# Patient Record
Sex: Male | Born: 1965 | Race: White | Hispanic: No | Marital: Single | Smoking: Current every day smoker
Health system: Southern US, Community
[De-identification: ages and names within clinical notes are randomized; demographics above are authoritative.]

## PROBLEM LIST (undated history)

## (undated) DIAGNOSIS — F419 Anxiety disorder, unspecified: Secondary | ICD-10-CM

## (undated) DIAGNOSIS — M549 Dorsalgia, unspecified: Secondary | ICD-10-CM

## (undated) DIAGNOSIS — I480 Paroxysmal atrial fibrillation: Secondary | ICD-10-CM

---

## 2010-10-30 ENCOUNTER — Emergency Department (HOSPITAL_BASED_OUTPATIENT_CLINIC_OR_DEPARTMENT_OTHER)
Admission: EM | Admit: 2010-10-30 | Discharge: 2010-10-30 | Disposition: A | Discharge status: Home / Self Care | Payer: Self-pay | Attending: Emergency Medicine | Admitting: Emergency Medicine

## 2010-10-30 ENCOUNTER — Emergency Department (INDEPENDENT_AMBULATORY_CARE_PROVIDER_SITE_OTHER): Payer: Self-pay

## 2010-10-30 DIAGNOSIS — F17200 Nicotine dependence, unspecified, uncomplicated: Secondary | ICD-10-CM | POA: Insufficient documentation

## 2010-10-30 DIAGNOSIS — W269XXA Contact with unspecified sharp object(s), initial encounter: Secondary | ICD-10-CM

## 2010-10-30 DIAGNOSIS — W268XXA Contact with other sharp object(s), not elsewhere classified, initial encounter: Secondary | ICD-10-CM | POA: Insufficient documentation

## 2010-10-30 DIAGNOSIS — M19049 Primary osteoarthritis, unspecified hand: Secondary | ICD-10-CM

## 2010-10-30 DIAGNOSIS — S61411A Laceration without foreign body of right hand, initial encounter: Secondary | ICD-10-CM

## 2010-10-30 DIAGNOSIS — Y92009 Unspecified place in unspecified non-institutional (private) residence as the place of occurrence of the external cause: Secondary | ICD-10-CM | POA: Insufficient documentation

## 2010-10-30 DIAGNOSIS — S61409A Unspecified open wound of unspecified hand, initial encounter: Secondary | ICD-10-CM | POA: Insufficient documentation

## 2010-10-30 MED ORDER — TETANUS-DIPHTH-ACELL PERTUSSIS 5-2.5-18.5 LF-MCG/0.5 IM SUSP
INTRAMUSCULAR | Status: AC
  Filled 2010-10-30: qty 0.5

## 2010-10-30 MED ORDER — HYDROCODONE-ACETAMINOPHEN 5-325 MG PO TABS: 2.0000 | ORAL_TABLET | ORAL | Status: AC | PRN

## 2010-10-30 MED ORDER — TETANUS-DIPHTH-ACELL PERTUSSIS 5-2.5-18.5 LF-MCG/0.5 IM SUSP
0.5000 mL | Freq: Once | INTRAMUSCULAR | Status: AC
  Administered 2010-10-30: 0.5 mL via INTRAMUSCULAR

## 2010-10-30 NOTE — ED Notes (Signed)
Laceration to right hand while jacking up a car trailer

## 2010-10-30 NOTE — ED Notes (Signed)
Pt stated he was checking on his wife in the waiting room

## 2010-10-30 NOTE — ED Provider Notes (Signed)
History     CSN: [id number] Arrival date & time: 10/30/2010  1:56 PM  Chief Complaint  Patient presents with  . Hand Injury   Patient is a 45 y.o. male presenting with hand injury. The history is provided by the patient.  Hand Injury  The incident occurred less than 1 hour ago. The incident occurred at home. The injury mechanism was an incision. The pain is present in the right hand. The quality of the pain is described as sharp and burning. The pain is at a severity of 3/10. The pain is moderate. The pain has been constant since the incident. Pertinent negatives include no fever. He reports no foreign bodies present. The symptoms are aggravated by movement. He has tried nothing for the symptoms. The treatment provided no relief.  Pt cut finger over a broken piece of metal.  History reviewed. No pertinent past medical history.  History reviewed. No pertinent past surgical history.  History reviewed. No pertinent family history.  History  Substance Use Topics  . Smoking status: Current Everyday Smoker -- 1.0 packs/day  . Smokeless tobacco: Not on file  . Alcohol Use: Yes     1/2 pint/ day      Review of Systems  Constitutional: Negative for fever.  Skin: Positive for wound.  All other systems reviewed and are negative.    Physical Exam  BP 140/85  Pulse 80  Temp(Src) 98.1 F (36.7 C) (Oral)  Resp 20  SpO2 96%  Physical Exam  Nursing note and vitals reviewed. Constitutional: He is oriented to person, place, and time. He appears well-developed.  HENT:  Head: Normocephalic.  Neck: Normal range of motion.  Cardiovascular: Normal rate.   Pulmonary/Chest: Effort normal.  Musculoskeletal: He exhibits tenderness.  Neurological: He is alert and oriented to person, place, and time. He has normal reflexes.  Skin: There is erythema.       1 cm laceration between thumb and 2nd finger    ED Course  LACERATION REPAIR Date/Time: 10/30/2010 3:44 PM Performed by: Langston Masker Authorized by: Valma Cava A. Consent: Verbal consent obtained. Consent given by: patient Laceration length: 1 cm Foreign bodies: no foreign bodies Tendon involvement: none Nerve involvement: none Vascular damage: no Anesthesia: local infiltration Local anesthetic: lidocaine 2% without epinephrine Patient sedated: no Preparation: Patient was prepped and draped in the usual sterile fashion. Irrigation solution: saline Irrigation method: syringe Amount of cleaning: standard Debridement: none Degree of undermining: none Skin closure: 4-0 Prolene Number of sutures: 2 Technique: simple Approximation: loose Approximation difficulty: simple Patient tolerance: Patient tolerated the procedure well with no immediate complications.    MDM I discussed need for follow up and suture removal in 8 days   Medical screening examination/treatment/procedure(s) were performed by non-physician practitioner and as supervising physician I was immediately available for consultation/collaboration. Osvaldo Human, M.D.   Ali Molina, [place] 10/30/10 1545  Carleene Cooper III, MD 10/31/10 [phone]

## 2013-04-27 ENCOUNTER — Ambulatory Visit: Payer: Self-pay

## 2014-03-10 ENCOUNTER — Emergency Department (HOSPITAL_BASED_OUTPATIENT_CLINIC_OR_DEPARTMENT_OTHER)
Admission: EM | Admit: 2014-03-10 | Discharge: 2014-03-10 | Disposition: A | Discharge status: Home / Self Care | Payer: Self-pay | Attending: Emergency Medicine | Admitting: Emergency Medicine

## 2014-03-10 ENCOUNTER — Encounter (HOSPITAL_BASED_OUTPATIENT_CLINIC_OR_DEPARTMENT_OTHER): Payer: Self-pay | Admitting: *Deleted

## 2014-03-10 DIAGNOSIS — Z720 Tobacco use: Secondary | ICD-10-CM | POA: Insufficient documentation

## 2014-03-10 DIAGNOSIS — R1084 Generalized abdominal pain: Secondary | ICD-10-CM | POA: Insufficient documentation

## 2014-03-10 DIAGNOSIS — R112 Nausea with vomiting, unspecified: Secondary | ICD-10-CM | POA: Insufficient documentation

## 2014-03-10 DIAGNOSIS — R634 Abnormal weight loss: Secondary | ICD-10-CM | POA: Insufficient documentation

## 2014-03-10 LAB — CBC WITH DIFFERENTIAL/PLATELET
Basophils Absolute: 0 10*3/uL (ref 0.0–0.1)
Basophils Relative: 0 % (ref 0–1)
EOS ABS: 0.1 10*3/uL (ref 0.0–0.7)
EOS PCT: 2 % (ref 0–5)
HEMATOCRIT: 42.1 % (ref 39.0–52.0)
Hemoglobin: 14.2 g/dL (ref 13.0–17.0)
LYMPHS ABS: 2.3 10*3/uL (ref 0.7–4.0)
LYMPHS PCT: 33 % (ref 12–46)
MCH: 32.6 pg (ref 26.0–34.0)
MCHC: 33.7 g/dL (ref 30.0–36.0)
MCV: 96.8 fL (ref 78.0–100.0)
MONO ABS: 0.8 10*3/uL (ref 0.1–1.0)
MONOS PCT: 12 % (ref 3–12)
Neutro Abs: 3.7 10*3/uL (ref 1.7–7.7)
Neutrophils Relative %: 53 % (ref 43–77)
PLATELETS: 266 10*3/uL (ref 150–400)
RBC: 4.35 MIL/uL (ref 4.22–5.81)
RDW: 13 % (ref 11.5–15.5)
WBC: 7 10*3/uL (ref 4.0–10.5)

## 2014-03-10 LAB — URINALYSIS, ROUTINE W REFLEX MICROSCOPIC
BILIRUBIN URINE: NEGATIVE
Glucose, UA: NEGATIVE mg/dL
Hgb urine dipstick: NEGATIVE
Ketones, ur: NEGATIVE mg/dL
Leukocytes, UA: NEGATIVE
NITRITE: NEGATIVE
Protein, ur: NEGATIVE mg/dL
SPECIFIC GRAVITY, URINE: 1.026 (ref 1.005–1.030)
UROBILINOGEN UA: 1 mg/dL (ref 0.0–1.0)
pH: 7.5 (ref 5.0–8.0)

## 2014-03-10 LAB — COMPREHENSIVE METABOLIC PANEL
ALBUMIN: 3.9 g/dL (ref 3.5–5.2)
ALT: 44 U/L (ref 0–53)
AST: 33 U/L (ref 0–37)
Alkaline Phosphatase: 86 U/L (ref 39–117)
Anion gap: 6 (ref 5–15)
BUN: 16 mg/dL (ref 6–23)
CALCIUM: 8.8 mg/dL (ref 8.4–10.5)
CHLORIDE: 109 meq/L (ref 96–112)
CO2: 27 mmol/L (ref 19–32)
CREATININE: 0.74 mg/dL (ref 0.50–1.35)
GFR calc Af Amer: >90 mL/min (ref >90)
Glucose, Bld: 98 mg/dL (ref 70–99)
Potassium: 4.1 mmol/L (ref 3.5–5.1)
SODIUM: 142 mmol/L (ref 135–145)
Total Bilirubin: 0.2 mg/dL — ABNORMAL LOW (ref 0.3–1.2)
Total Protein: 6.5 g/dL (ref 6.0–8.3)

## 2014-03-10 LAB — LIPASE, BLOOD: Lipase: 22 U/L (ref 11–59)

## 2014-03-10 MED ORDER — OMEPRAZOLE 20 MG PO CPDR: 20.0000 mg | DELAYED_RELEASE_CAPSULE | Freq: Every day | ORAL | Status: DC

## 2014-03-10 NOTE — ED Provider Notes (Signed)
CSN: [id number]     Arrival date & time 03/10/14  1522 History   First MD Initiated Contact with Patient 03/10/14 1718     Chief Complaint  Patient presents with  . Abdominal Pain     (Consider location/radiation/quality/duration/timing/severity/associated sxs/prior Treatment) HPI Comments: Patient presents with intermittent abdominal pain and weight loss. He states over last 3 months he's had some intermittent pain across his abdomen. The pain is usually in his lower abdomen but currently denies any pain. He has some intermittent vomiting. He states normally as he is eating he occasionally vomits up food. He feels like it's due to acid reflux. He's had about a 20-25 pound weight loss over the last 2-3 months. He denies any blood in his emesis. He denies any blood in the stool. He denies any melena. He's having normal bowel movements. He drinks alcohol frequently about a pint 5 days a week. He also drinks a lot of caffeine. Today he had one episode of vomiting and saw a small worm in his emesis. He has not seen any worms in his stool.  Patient is a 48 y.o. male presenting with abdominal pain.  Abdominal Pain Associated symptoms: nausea and vomiting   Associated symptoms: no chest pain, no chills, no cough, no diarrhea, no fatigue, no fever, no hematuria and no shortness of breath     History reviewed. No pertinent past medical history. History reviewed. No pertinent past surgical history. No family history on file. History  Substance Use Topics  . Smoking status: Current Every Day Smoker -- 1.00 packs/day  . Smokeless tobacco: Not on file  . Alcohol Use: Yes     Comment: 1/2 pint/ day    Review of Systems  Constitutional: Positive for unexpected weight change. Negative for fever, chills, diaphoresis and fatigue.  HENT: Negative for congestion, rhinorrhea and sneezing.   Eyes: Negative.   Respiratory: Negative for cough, chest tightness and shortness of breath.   Cardiovascular:  Negative for chest pain and leg swelling.  Gastrointestinal: Positive for nausea, vomiting and abdominal pain. Negative for diarrhea and blood in stool.  Genitourinary: Negative for frequency, hematuria, flank pain and difficulty urinating.  Musculoskeletal: Negative for back pain and arthralgias.  Skin: Negative for rash.  Neurological: Negative for dizziness, speech difficulty, weakness, numbness and headaches.      Allergies  Review of patient's allergies indicates no known allergies.  Home Medications   Prior to Admission medications   Medication Sig Start Date End Date Taking? Authorizing Provider  omeprazole (PRILOSEC) 20 MG capsule Take 1 capsule (20 mg total) by mouth daily. 03/10/14   Rolan BuccoMelanie Daray Polgar, MD   BP 129/81 mmHg  Pulse 77  Temp(Src) 98.4 F (36.9 C) (Oral)  Resp 18  Ht 5\' 11"  (1.803 m)  Wt 155 lb (70.308 kg)  BMI 21.63 kg/m2  SpO2 99% Physical Exam  Constitutional: He is oriented to person, place, and time. He appears well-developed and well-nourished.  HENT:  Head: Normocephalic and atraumatic.  Eyes: Pupils are equal, round, and reactive to light.  Neck: Normal range of motion. Neck supple.  Cardiovascular: Normal rate, regular rhythm and normal heart sounds.   Pulmonary/Chest: Effort normal and breath sounds normal. No respiratory distress. He has no wheezes. He has no rales. He exhibits no tenderness.  Abdominal: Soft. Bowel sounds are normal. There is no tenderness. There is no rebound and no guarding.  Musculoskeletal: Normal range of motion. He exhibits no edema.  Lymphadenopathy:    He has no  cervical adenopathy.  Neurological: He is alert and oriented to person, place, and time.  Skin: Skin is warm and dry. No rash noted.  Psychiatric: He has a normal mood and affect.    ED Course  Procedures (including critical care time) Labs Review Results for orders placed or performed during the hospital encounter of 03/10/14  Urinalysis, Routine w reflex  microscopic  Result Value Ref Range   Color, Urine YELLOW YELLOW   APPearance CLEAR CLEAR   Specific Gravity, Urine 1.026 1.005 - 1.030   pH 7.5 5.0 - 8.0   Glucose, UA NEGATIVE NEGATIVE mg/dL   Hgb urine dipstick NEGATIVE NEGATIVE   Bilirubin Urine NEGATIVE NEGATIVE   Ketones, ur NEGATIVE NEGATIVE mg/dL   Protein, ur NEGATIVE NEGATIVE mg/dL   Urobilinogen, UA 1.0 0.0 - 1.0 mg/dL   Nitrite NEGATIVE NEGATIVE   Leukocytes, UA NEGATIVE NEGATIVE  Comprehensive metabolic panel  Result Value Ref Range   Sodium 142 135 - 145 mmol/L   Potassium 4.1 3.5 - 5.1 mmol/L   Chloride 109 96 - 112 mEq/L   CO2 27 19 - 32 mmol/L   Glucose, Bld 98 70 - 99 mg/dL   BUN 16 6 - 23 mg/dL   Creatinine, Ser 1.610.74 0.50 - 1.35 mg/dL   Calcium 8.8 8.4 - 09.610.5 mg/dL   Total Protein 6.5 6.0 - 8.3 g/dL   Albumin 3.9 3.5 - 5.2 g/dL   AST 33 0 - 37 U/L   ALT 44 0 - 53 U/L   Alkaline Phosphatase 86 39 - 117 U/L   Total Bilirubin 0.2 (L) 0.3 - 1.2 mg/dL   GFR calc non Af Amer >90 >90 mL/min   GFR calc Af Amer >90 >90 mL/min   Anion gap 6 5 - 15  CBC with Differential  Result Value Ref Range   WBC 7.0 4.0 - 10.5 K/uL   RBC 4.35 4.22 - 5.81 MIL/uL   Hemoglobin 14.2 13.0 - 17.0 g/dL   HCT 04.542.1 40.939.0 - 81.152.0 %   MCV 96.8 78.0 - 100.0 fL   MCH 32.6 26.0 - 34.0 pg   MCHC 33.7 30.0 - 36.0 g/dL   RDW 91.413.0 78.211.5 - 95.615.5 %   Platelets 266 150 - 400 K/uL   Neutrophils Relative % 53 43 - 77 %   Neutro Abs 3.7 1.7 - 7.7 K/uL   Lymphocytes Relative 33 12 - 46 %   Lymphs Abs 2.3 0.7 - 4.0 K/uL   Monocytes Relative 12 3 - 12 %   Monocytes Absolute 0.8 0.1 - 1.0 K/uL   Eosinophils Relative 2 0 - 5 %   Eosinophils Absolute 0.1 0.0 - 0.7 K/uL   Basophils Relative 0 0 - 1 %   Basophils Absolute 0.0 0.0 - 0.1 K/uL  Lipase, blood  Result Value Ref Range   Lipase 22 11 - 59 U/L   No results found.    Imaging Review No results found.   EKG Interpretation None      MDM   Final diagnoses:  Generalized abdominal  pain    Patient has no abdominal pain currently. His labs are unremarkable. He has some symptoms that are consistent with GERD. I will go ahead and start him on a PPI. He brings me in a small worm that he says he vomited up. I explained to him that this could've been in something he ate. I did encourage him to follow-up with a gastroenterologist and that if he continues to have these  symptoms that he likely will need a stool sample to assess for parasites in his stool. I gave him a referral to follow-up with gastroenterology.    Rolan Bucco, MD 03/10/14 [phone]

## 2014-03-10 NOTE — ED Notes (Addendum)
Abdominal pain x 3 months. Vomited yesterday with a worm in the vomitus. Weight loss for the past months. No energy.

## 2014-03-10 NOTE — Discharge Instructions (Signed)

## 2019-08-27 ENCOUNTER — Other Ambulatory Visit: Payer: Self-pay

## 2019-08-27 ENCOUNTER — Encounter (HOSPITAL_BASED_OUTPATIENT_CLINIC_OR_DEPARTMENT_OTHER): Payer: Self-pay | Admitting: *Deleted

## 2019-08-27 ENCOUNTER — Emergency Department (HOSPITAL_BASED_OUTPATIENT_CLINIC_OR_DEPARTMENT_OTHER)
Admission: EM | Admit: 2019-08-27 | Discharge: 2019-08-28 | Disposition: A | Discharge status: Home / Self Care | Payer: Self-pay | Attending: Emergency Medicine | Admitting: Emergency Medicine

## 2019-08-27 ENCOUNTER — Emergency Department (HOSPITAL_BASED_OUTPATIENT_CLINIC_OR_DEPARTMENT_OTHER): Payer: Self-pay

## 2019-08-27 DIAGNOSIS — F10129 Alcohol abuse with intoxication, unspecified: Secondary | ICD-10-CM | POA: Insufficient documentation

## 2019-08-27 DIAGNOSIS — Y9289 Other specified places as the place of occurrence of the external cause: Secondary | ICD-10-CM | POA: Insufficient documentation

## 2019-08-27 DIAGNOSIS — Y9352 Activity, horseback riding: Secondary | ICD-10-CM | POA: Insufficient documentation

## 2019-08-27 DIAGNOSIS — F10920 Alcohol use, unspecified with intoxication, uncomplicated: Secondary | ICD-10-CM

## 2019-08-27 DIAGNOSIS — S8264XA Nondisplaced fracture of lateral malleolus of right fibula, initial encounter for closed fracture: Secondary | ICD-10-CM | POA: Insufficient documentation

## 2019-08-27 DIAGNOSIS — Y998 Other external cause status: Secondary | ICD-10-CM | POA: Insufficient documentation

## 2019-08-27 DIAGNOSIS — F17210 Nicotine dependence, cigarettes, uncomplicated: Secondary | ICD-10-CM | POA: Insufficient documentation

## 2019-08-27 HISTORY — DX: Dorsalgia, unspecified: M54.9

## 2019-08-27 HISTORY — DX: Anxiety disorder, unspecified: F41.9

## 2019-08-27 MED ORDER — ACETAMINOPHEN 325 MG PO TABS
650.0000 mg | ORAL_TABLET | Freq: Once | ORAL | Status: AC
  Administered 2019-08-28: 650 mg via ORAL
  Filled 2019-08-27: qty 2

## 2019-08-27 NOTE — ED Triage Notes (Signed)
Brought by EMS from home , pt c/o fall from horse x 2 hrs ago , ETOH

## 2019-08-28 NOTE — ED Notes (Signed)
Pt. Stated he has crutches at home, right size from previous surgery.

## 2019-08-28 NOTE — ED Notes (Signed)
Patient transported to X-ray 

## 2019-08-28 NOTE — ED Provider Notes (Signed)
[facility] EMERGENCY DEPARTMENT Provider Note   CSN: [id number] Arrival date & time: 08/27/19  2314     History Chief Complaint  Patient presents with  . Ankle Injury    Matthew Shepard is a 54 y.o. male.  HPI     This is a 54 year old male with history of alcohol abuse who presents with right ankle pain.  Patient reports that he was riding his horse when he fell off landing on his right ankle.  Denies hitting his head or loss of consciousness.  Denies any other injury or pain.  He states that he has not been able to ambulate and rates his pain at 10 out of 10.  He has not taken anything for the pain.  He does report significant alcohol use today.  He drinks daily.  Denies any numbness or tingling of the foot.  Reports that he had to crawl back to his house.  Past Medical History:  Diagnosis Date  . Anxiety   . Back pain     There are no problems to display for this patient.   History reviewed. No pertinent surgical history.     History reviewed. No pertinent family history.  Social History   Tobacco Use  . Smoking status: Current Every Day Smoker    Packs/day: 1.00  . Smokeless tobacco: Never Used  Substance Use Topics  . Alcohol use: Yes    Comment: 1/2 pint/ day  . Drug use: No    Home Medications Prior to Admission medications   Medication Sig Start Date End Date Taking? Authorizing Provider  sertraline (ZOLOFT) 50 MG tablet Take by mouth. 04/17/19  Yes [provider]  gabapentin (NEURONTIN) 300 MG capsule Take 300 mg by mouth 3 (three) times daily. 04/17/19   [provider]  omeprazole (PRILOSEC) 20 MG capsule Take 1 capsule (20 mg total) by mouth daily. 03/10/14   Malvin Johns, MD    Allergies    Patient has no known allergies.  Review of Systems   Review of Systems  Respiratory: Negative for shortness of breath.   Cardiovascular: Negative for chest pain.  Musculoskeletal:       Right ankle pain  Neurological:  Negative for weakness, numbness and headaches.  All other systems reviewed and are negative.   Physical Exam Updated Vital Signs BP 117/82   Pulse 87   Temp 98.3 F (36.8 C) (Oral)   Resp 18   SpO2 94%   Physical Exam Vitals and nursing note reviewed.  Constitutional:      Appearance: He is well-developed.     Comments: Disheveled appearing but nontoxic, ABCs intact  HENT:     Head: Normocephalic and atraumatic.     Nose: Nose normal.     Mouth/Throat:     Mouth: Mucous membranes are dry.  Eyes:     Pupils: Pupils are equal, round, and reactive to light.  Cardiovascular:     Rate and Rhythm: Normal rate and regular rhythm.     Heart sounds: Normal heart sounds. No murmur heard.   Pulmonary:     Effort: Pulmonary effort is normal. No respiratory distress.     Breath sounds: Normal breath sounds. No wheezing.  Chest:     Chest wall: No tenderness.  Abdominal:     General: Bowel sounds are normal.     Palpations: Abdomen is soft.     Tenderness: There is no abdominal tenderness. There is no rebound.  Musculoskeletal:  Cervical back: Neck supple.     Comments: Patient with swelling about the medial and lateral aspects of the right ankle, no obvious deformity, 2+ DP pulse and neurovascular intact distally, tenderness to palpation over the lateral malleolus, no proximal fibular tenderness  Lymphadenopathy:     Cervical: No cervical adenopathy.  Skin:    General: Skin is warm and dry.     Comments: Multiple old excoriations and abrasions  Neurological:     Mental Status: He is alert and oriented to person, place, and time.  Psychiatric:     Comments: Appears intoxicated     ED Results / Procedures / Treatments   Labs (all labs ordered are listed, but only abnormal results are displayed) Labs Reviewed - No data to display  EKG None  Radiology DG Ankle Complete Right  Result Date: 08/28/2019 CLINICAL DATA:  Fall, deformity EXAM: RIGHT ANKLE - COMPLETE 3+ VIEW  COMPARISON:  None. FINDINGS: There is a nondisplaced obliquely oriented fracture of the distal fibula. The ankle mortise appears to be congruent. Diffuse soft tissue swelling seen over the fibula. IMPRESSION: Nondisplaced distal fibular fracture with overlying soft tissue swelling. Electronically Signed   By: Jonna Clark M.D.   On: 08/28/2019 00:25    Procedures Procedures (including critical care time)  Medications Ordered in ED Medications  acetaminophen (TYLENOL) tablet 650 mg (650 mg Oral Given 08/28/19 0033)    ED Course  I have reviewed the triage vital signs and the nursing notes.  Pertinent labs & imaging results that were available during my care of the patient were reviewed by me and considered in my medical decision making (see chart for details).    MDM Rules/Calculators/A&P                           Patient presents with an injury to the right ankle.  He is clearly intoxicated but there is no obvious other injury.  Reports that he did not hit his head or lose consciousness.  He is otherwise awake, alert, oriented.  He has soft tissue swelling to the right ankle with lateral malleoli or tenderness.  No proximal fibular tenderness.  Considerations include fracture versus sprain.  X-rays obtained.  X-rays reviewed by myself and show a nondisplaced distal fibular fracture.  Patient was immobilized and given crutches.  I discussed with him that he needs to watch his alcohol use as this will increase his likelihood of falls.  I recommended ice and elevation.  He was not provided with any narcotic pain medication as I feel this could make him altered.  Recommend ibuprofen or Tylenol.  Patient was referred to orthopedics.  In basket message sent to Dr. Susa Simmonds.  After history, exam, and medical workup I feel the patient has been appropriately medically screened and is safe for discharge home. Pertinent diagnoses were discussed with the patient. Patient was given return  precautions.   Final Clinical Impression(s) / ED Diagnoses Final diagnoses:  Closed nondisplaced fracture of lateral malleolus of right fibula, initial encounter  Alcoholic intoxication without complication [facility])    Rx / DC Orders ED Discharge Orders    None       Shon Baton, MD 08/28/19 0126

## 2019-08-28 NOTE — Discharge Instructions (Addendum)
You were seen today for an injury to the right ankle.  You have a fracture of your lateral malleolus.  Keep iced and elevated.  Follow-up with orthopedics.  Do not bear weight.  You should also decrease alcohol consumption as this will increase your risk for falls.

## 2019-08-28 NOTE — ED Notes (Signed)
Notified wife for pick in 30 mins

## 2020-04-10 ENCOUNTER — Encounter (HOSPITAL_BASED_OUTPATIENT_CLINIC_OR_DEPARTMENT_OTHER): Payer: Self-pay | Admitting: Emergency Medicine

## 2020-04-10 ENCOUNTER — Emergency Department (HOSPITAL_BASED_OUTPATIENT_CLINIC_OR_DEPARTMENT_OTHER)
Admission: EM | Admit: 2020-04-10 | Discharge: 2020-04-10 | Disposition: A | Discharge status: Home / Self Care | Payer: Self-pay | Attending: Emergency Medicine | Admitting: Emergency Medicine

## 2020-04-10 ENCOUNTER — Emergency Department (HOSPITAL_BASED_OUTPATIENT_CLINIC_OR_DEPARTMENT_OTHER): Payer: Self-pay

## 2020-04-10 ENCOUNTER — Other Ambulatory Visit: Payer: Self-pay

## 2020-04-10 DIAGNOSIS — R1013 Epigastric pain: Secondary | ICD-10-CM | POA: Insufficient documentation

## 2020-04-10 DIAGNOSIS — F10230 Alcohol dependence with withdrawal, uncomplicated: Secondary | ICD-10-CM

## 2020-04-10 DIAGNOSIS — F17200 Nicotine dependence, unspecified, uncomplicated: Secondary | ICD-10-CM | POA: Insufficient documentation

## 2020-04-10 DIAGNOSIS — R002 Palpitations: Secondary | ICD-10-CM | POA: Insufficient documentation

## 2020-04-10 DIAGNOSIS — R110 Nausea: Secondary | ICD-10-CM | POA: Insufficient documentation

## 2020-04-10 DIAGNOSIS — F10930 Alcohol use, unspecified with withdrawal, uncomplicated: Secondary | ICD-10-CM

## 2020-04-10 DIAGNOSIS — Z20822 Contact with and (suspected) exposure to covid-19: Secondary | ICD-10-CM | POA: Insufficient documentation

## 2020-04-10 DIAGNOSIS — F10239 Alcohol dependence with withdrawal, unspecified: Secondary | ICD-10-CM | POA: Insufficient documentation

## 2020-04-10 LAB — CBC WITH DIFFERENTIAL/PLATELET
Abs Immature Granulocytes: 0.01 10*3/uL (ref 0.00–0.07)
Basophils Absolute: 0 10*3/uL (ref 0.0–0.1)
Basophils Relative: 1 %
Eosinophils Absolute: 0 10*3/uL (ref 0.0–0.5)
Eosinophils Relative: 0 %
HCT: 40.6 % (ref 39.0–52.0)
Hemoglobin: 13.8 g/dL (ref 13.0–17.0)
Immature Granulocytes: 0 %
Lymphocytes Relative: 24 %
Lymphs Abs: 1.1 10*3/uL (ref 0.7–4.0)
MCH: 33.6 pg (ref 26.0–34.0)
MCHC: 34 g/dL (ref 30.0–36.0)
MCV: 98.8 fL (ref 80.0–100.0)
Monocytes Absolute: 0.5 10*3/uL (ref 0.1–1.0)
Monocytes Relative: 11 %
Neutro Abs: 3 10*3/uL (ref 1.7–7.7)
Neutrophils Relative %: 64 %
Platelets: 214 10*3/uL (ref 150–400)
RBC: 4.11 MIL/uL — ABNORMAL LOW (ref 4.22–5.81)
RDW: 15.8 % — ABNORMAL HIGH (ref 11.5–15.5)
WBC: 4.7 10*3/uL (ref 4.0–10.5)
nRBC: 0 % (ref 0.0–0.2)

## 2020-04-10 LAB — COMPREHENSIVE METABOLIC PANEL
ALT: 77 U/L — ABNORMAL HIGH (ref 0–44)
AST: 92 U/L — ABNORMAL HIGH (ref 15–41)
Albumin: 4.4 g/dL (ref 3.5–5.0)
Alkaline Phosphatase: 74 U/L (ref 38–126)
Anion gap: 16 — ABNORMAL HIGH (ref 5–15)
BUN: 15 mg/dL (ref 6–20)
CO2: 24 mmol/L (ref 22–32)
Calcium: 8.7 mg/dL — ABNORMAL LOW (ref 8.9–10.3)
Chloride: 97 mmol/L — ABNORMAL LOW (ref 98–111)
Creatinine, Ser: 0.7 mg/dL (ref 0.61–1.24)
GFR, Estimated: >60 mL/min (ref >60)
Glucose, Bld: 113 mg/dL — ABNORMAL HIGH (ref 70–99)
Potassium: 3.9 mmol/L (ref 3.5–5.1)
Sodium: 137 mmol/L (ref 135–145)
Total Bilirubin: 0.7 mg/dL (ref 0.3–1.2)
Total Protein: 7.3 g/dL (ref 6.5–8.1)

## 2020-04-10 LAB — LIPASE, BLOOD: Lipase: 25 U/L (ref 11–51)

## 2020-04-10 LAB — TROPONIN I (HIGH SENSITIVITY)
Troponin I (High Sensitivity): 13 ng/L (ref <18)
Troponin I (High Sensitivity): 13 ng/L (ref <18)

## 2020-04-10 LAB — SARS CORONAVIRUS 2 (TAT 6-24 HRS): SARS Coronavirus 2: NEGATIVE

## 2020-04-10 LAB — MAGNESIUM: Magnesium: 1.3 mg/dL — ABNORMAL LOW (ref 1.7–2.4)

## 2020-04-10 LAB — BRAIN NATRIURETIC PEPTIDE: B Natriuretic Peptide: 37.1 pg/mL (ref 0.0–100.0)

## 2020-04-10 LAB — ETHANOL: Alcohol, Ethyl (B): 75 mg/dL — ABNORMAL HIGH (ref <10)

## 2020-04-10 MED ORDER — LORAZEPAM 2 MG/ML IJ SOLN
0.0000 mg | Freq: Four times a day (QID) | INTRAMUSCULAR | Status: DC
  Administered 2020-04-10: 2 mg via INTRAVENOUS
  Filled 2020-04-10: qty 1

## 2020-04-10 MED ORDER — LORAZEPAM 1 MG PO TABS: 0.0000 mg | ORAL_TABLET | Freq: Two times a day (BID) | ORAL | Status: DC

## 2020-04-10 MED ORDER — LORAZEPAM 1 MG PO TABS: 0.0000 mg | ORAL_TABLET | Freq: Four times a day (QID) | ORAL | Status: DC

## 2020-04-10 MED ORDER — THIAMINE HCL 100 MG/ML IJ SOLN: 100.0000 mg | Freq: Every day | INTRAMUSCULAR | Status: DC

## 2020-04-10 MED ORDER — MAGNESIUM SULFATE 2 GM/50ML IV SOLN
2.0000 g | Freq: Once | INTRAVENOUS | Status: AC
  Administered 2020-04-10: 2 g via INTRAVENOUS
  Filled 2020-04-10: qty 50

## 2020-04-10 MED ORDER — CHLORDIAZEPOXIDE HCL 25 MG PO CAPS: ORAL_CAPSULE | ORAL | 0 refills | Status: DC

## 2020-04-10 MED ORDER — LORAZEPAM 2 MG/ML IJ SOLN: 0.0000 mg | Freq: Two times a day (BID) | INTRAMUSCULAR | Status: DC

## 2020-04-10 MED ORDER — THIAMINE HCL 100 MG PO TABS: 100.0000 mg | ORAL_TABLET | Freq: Every day | ORAL | Status: DC

## 2020-04-10 MED ORDER — SODIUM CHLORIDE 0.9 % IV BOLUS: 1000.0000 mL | Freq: Once | INTRAVENOUS | Status: DC

## 2020-04-10 NOTE — ED Provider Notes (Signed)
[facility] EMERGENCY DEPARTMENT Provider Note   CSN: [id number] Arrival date & time: 04/10/20  0010     History No chief complaint on file.   Matthew Shepard is a 56 y.o. male.  HPI     This is a 55 year old male with a history of alcohol abuse, withdrawal, atrial fibrillation who presents with palpitations. Patient reports that he feels like his heart has been racing all day. He reports fluttering in his chest. No chest pain or pressure. States that he had similar symptoms before and was diagnosed with atrial fibrillation. He states he is supposed to be on a medication but he is no longer taking these medications. He continues to drink daily. He drinks up to 1/5 of liquor. He has not had anything to drink today and feels tremulous. No history of alcohol withdrawal seizures. He denies leg swelling. No shortness of breath, fevers, recent illnesses. Patient does report some nausea and epigastric discomfort. He states when he has not had alcohol for sometimes, he gets very nauseated  Of note, I reviewed the patient's chart. He was admitted to [facility] in October. He was found to have new onset atrial fibrillation with RVR in the setting of alcohol withdrawal. He had an echocardiogram that showed an EF of 35%. Description of EKG (images not available) with noted left bundle branch block. No cardiac cath at that time.  Past Medical History:  Diagnosis Date  . Anxiety   . Back pain     There are no problems to display for this patient.   History reviewed. No pertinent surgical history.     No family history on file.  Social History   Tobacco Use  . Smoking status: Current Every Day Smoker    Packs/day: 1.00  . Smokeless tobacco: Never Used  Substance Use Topics  . Alcohol use: Yes    Comment: 1/2 pint/ day  . Drug use: No    Home Medications Prior to Admission medications   Medication Sig Start Date End Date Taking? Authorizing Provider   chlordiazePOXIDE (LIBRIUM) 25 MG capsule 50mg  PO TID x 1D, then 25-50mg  PO BID X 1D, then 25-50mg  PO QD X 1D 04/10/20  Yes Shatasha Lambing, 04/12/20, MD  gabapentin (NEURONTIN) 300 MG capsule Take 300 mg by mouth 3 (three) times daily. 04/17/19   [provider]  omeprazole (PRILOSEC) 20 MG capsule Take 1 capsule (20 mg total) by mouth daily. 03/10/14   03/12/14, MD  sertraline (ZOLOFT) 50 MG tablet Take by mouth. 04/17/19   [provider]    Allergies    Patient has no known allergies.  Review of Systems   Review of Systems  Constitutional: Negative for fever.  Respiratory: Negative for chest tightness and shortness of breath.   Cardiovascular: Positive for palpitations. Negative for chest pain and leg swelling.  Gastrointestinal: Negative for abdominal pain.  Genitourinary: Negative for dysuria.  Neurological: Positive for tremors.  All other systems reviewed and are negative.   Physical Exam Updated Vital Signs BP 129/76   Pulse 92   Resp 17   Ht 1.803 m (5\' 11" )   Wt 81.6 kg   SpO2 93%   BMI 25.10 kg/m   Physical Exam Vitals and nursing note reviewed.  Constitutional:      Appearance: He is well-developed and well-nourished.     Comments: Disheveled appearing, tremulous, nontoxic  HENT:     Head: Normocephalic and atraumatic.     Mouth/Throat:  Mouth: Mucous membranes are dry.  Eyes:     Pupils: Pupils are equal, round, and reactive to light.  Cardiovascular:     Rate and Rhythm: Normal rate and regular rhythm.     Heart sounds: Normal heart sounds. No murmur heard.   Pulmonary:     Effort: Pulmonary effort is normal. No respiratory distress.     Breath sounds: Normal breath sounds. No wheezing.  Abdominal:     General: Bowel sounds are normal.     Palpations: Abdomen is soft.     Tenderness: There is no abdominal tenderness. There is no rebound.  Musculoskeletal:        General: No edema.     Cervical back: Neck supple.     Right lower  leg: No edema.     Left lower leg: No edema.  Lymphadenopathy:     Cervical: No cervical adenopathy.  Skin:    General: Skin is warm and dry.  Neurological:     Mental Status: He is alert and oriented to person, place, and time.  Psychiatric:        Mood and Affect: Mood and affect and mood normal.     ED Results / Procedures / Treatments   Labs (all labs ordered are listed, but only abnormal results are displayed) Labs Reviewed  CBC WITH DIFFERENTIAL/PLATELET - Abnormal; Notable for the following components:      Result Value   RBC 4.11 (*)    RDW 15.8 (*)    All other components within normal limits  COMPREHENSIVE METABOLIC PANEL - Abnormal; Notable for the following components:   Chloride 97 (*)    Glucose, Bld 113 (*)    Calcium 8.7 (*)    AST 92 (*)    ALT 77 (*)    Anion gap 16 (*)    All other components within normal limits  MAGNESIUM - Abnormal; Notable for the following components:   Magnesium 1.3 (*)    All other components within normal limits  ETHANOL - Abnormal; Notable for the following components:   Alcohol, Ethyl (B) 75 (*)    All other components within normal limits  SARS CORONAVIRUS 2 (TAT 6-24 HRS)  LIPASE, BLOOD  BRAIN NATRIURETIC PEPTIDE  TROPONIN I (HIGH SENSITIVITY)  TROPONIN I (HIGH SENSITIVITY)    EKG EKG Interpretation  Date/Time:  Monday April 10 2020 00:25:38 EST Ventricular Rate:  97 PR Interval:    QRS Duration: 147 QT Interval:  425 QTC Calculation: 540 R Axis:   105 Text Interpretation: Sinus rhythm Left bundle branch block Prolonged QT interval Confirmed by Ross Marcus ([id number]) on 04/10/2020 1:09:01 AM   Radiology DG Chest Portable 1 View  Result Date: 04/10/2020 CLINICAL DATA:  Palpitations, history of atrial fibrillation EXAM: PORTABLE CHEST 1 VIEW COMPARISON:  Radiograph 01/01/2020 FINDINGS: No consolidation, features of edema, pneumothorax, or effusion. Pulmonary vascularity is normally distributed. The  cardiomediastinal contours are unremarkable. No acute osseous or soft tissue abnormality. IMPRESSION: No acute cardiopulmonary abnormality. Electronically Signed   By: Kreg Shropshire M.D.   On: 04/10/2020 01:10    Procedures Procedures   Medications Ordered in ED Medications  LORazepam (ATIVAN) injection 0-4 mg (2 mg Intravenous Given 04/10/20 0049)    Or  LORazepam (ATIVAN) tablet 0-4 mg ( Oral See Alternative 04/10/20 0049)  LORazepam (ATIVAN) injection 0-4 mg (has no administration in time range)    Or  LORazepam (ATIVAN) tablet 0-4 mg (has no administration in time range)  thiamine tablet 100  mg (has no administration in time range)    Or  thiamine (B-1) injection 100 mg (has no administration in time range)  magnesium sulfate IVPB 2 g 50 mL (0 g Intravenous Stopped 04/10/20 0240)    ED Course  I have reviewed the triage vital signs and the nursing notes.  Pertinent labs & imaging results that were available during my care of the patient were reviewed by me and considered in my medical decision making (see chart for details).  Clinical Course as of 04/10/20 0343  Mon Apr 10, 2020  9509 Repeat CIWA score is 3.  Patient no longer tremulous.  Discussed with him work-up.  Aside from magnesium, work-up is largely reassuring.  Highly suspect alcohol withdrawal as nidus for his symptoms.  Patient is interested in cessation.  He has no history of alcohol withdrawal seizures.  Candidate for Librium.  Discussed that usage and will provide resources for outpatient rehab. [CH]    Clinical Course User Index [CH] Teka Chanda, Mayer Masker, MD   MDM Rules/Calculators/A&P                          Patient presents with palpitations.  He is tremulous on exam but nontoxic and vital signs are reassuring.  No evidence of atrial fibrillation or arrhythmia on EKG.  Clinically I have high suspicion for alcohol withdrawal.  CIWA score was initially 11.  He was given Ativan per protocol.  Lab work obtained.   Troponin x2 -.  Doubt ACS.  Chest x-ray without pneumothorax or pneumonia.  CMP with evidence of slight transaminitis likely related to drinking.  Magnesium also noted to be low.  This was replaced.  I highly suspect that his symptoms are related to withdrawal.  See my discussion with him above.  He will be given an prescription for Librium and resources as an outpatient.  He stated understanding for instructions.  After history, exam, and medical workup I feel the patient has been appropriately medically screened and is safe for discharge home. Pertinent diagnoses were discussed with the patient. Patient was given return precautions.  Final Clinical Impression(s) / ED Diagnoses Final diagnoses:  Alcohol withdrawal syndrome without complication (HCC)  Palpitations    Rx / DC Orders ED Discharge Orders         Ordered    chlordiazePOXIDE (LIBRIUM) 25 MG capsule        04/10/20 0341           Shon Baton, MD 04/10/20 [phone]

## 2020-04-10 NOTE — ED Triage Notes (Signed)
Reports his heart has been racing all day.  Endorses pain to left chest.  Describes as a quivering.  Hx of afib.

## 2020-04-10 NOTE — Discharge Instructions (Addendum)
You were seen today for palpitations.  This is likely related to your alcohol use and withdrawal.  You may take Librium for the next 5 days if you choose to abstain from alcohol.  This will help with withdrawal symptoms.  See community resources for rehabilitation.

## 2020-04-10 NOTE — ED Notes (Signed)
ED Provider at bedside. 

## 2022-02-07 ENCOUNTER — Emergency Department (HOSPITAL_COMMUNITY): Payer: Self-pay

## 2022-02-07 ENCOUNTER — Other Ambulatory Visit: Payer: Self-pay

## 2022-02-07 ENCOUNTER — Inpatient Hospital Stay (HOSPITAL_COMMUNITY)
Admission: EM | Admit: 2022-02-07 | Discharge: 2022-02-11 | DRG: 896 | Disposition: A | Discharge status: Home / Self Care | Payer: Self-pay | Attending: Internal Medicine | Admitting: Internal Medicine

## 2022-02-07 DIAGNOSIS — F10930 Alcohol use, unspecified with withdrawal, uncomplicated: Secondary | ICD-10-CM

## 2022-02-07 DIAGNOSIS — R569 Unspecified convulsions: Secondary | ICD-10-CM | POA: Diagnosis present

## 2022-02-07 DIAGNOSIS — E876 Hypokalemia: Secondary | ICD-10-CM | POA: Diagnosis present

## 2022-02-07 DIAGNOSIS — E569 Vitamin deficiency, unspecified: Secondary | ICD-10-CM | POA: Diagnosis present

## 2022-02-07 DIAGNOSIS — G928 Other toxic encephalopathy: Secondary | ICD-10-CM | POA: Diagnosis present

## 2022-02-07 DIAGNOSIS — R9431 Abnormal electrocardiogram [ECG] [EKG]: Secondary | ICD-10-CM | POA: Diagnosis present

## 2022-02-07 DIAGNOSIS — F419 Anxiety disorder, unspecified: Secondary | ICD-10-CM | POA: Diagnosis present

## 2022-02-07 DIAGNOSIS — K219 Gastro-esophageal reflux disease without esophagitis: Secondary | ICD-10-CM | POA: Diagnosis present

## 2022-02-07 DIAGNOSIS — R6510 Systemic inflammatory response syndrome (SIRS) of non-infectious origin without acute organ dysfunction: Secondary | ICD-10-CM | POA: Diagnosis present

## 2022-02-07 DIAGNOSIS — Z888 Allergy status to other drugs, medicaments and biological substances status: Secondary | ICD-10-CM

## 2022-02-07 DIAGNOSIS — Z1152 Encounter for screening for COVID-19: Secondary | ICD-10-CM

## 2022-02-07 DIAGNOSIS — Z880 Allergy status to penicillin: Secondary | ICD-10-CM

## 2022-02-07 DIAGNOSIS — F10130 Alcohol abuse with withdrawal, uncomplicated: Principal | ICD-10-CM | POA: Diagnosis present

## 2022-02-07 DIAGNOSIS — Z79899 Other long term (current) drug therapy: Secondary | ICD-10-CM

## 2022-02-07 DIAGNOSIS — G629 Polyneuropathy, unspecified: Secondary | ICD-10-CM | POA: Diagnosis present

## 2022-02-07 DIAGNOSIS — D6959 Other secondary thrombocytopenia: Secondary | ICD-10-CM | POA: Diagnosis present

## 2022-02-07 DIAGNOSIS — Z87892 Personal history of anaphylaxis: Secondary | ICD-10-CM

## 2022-02-07 DIAGNOSIS — I447 Left bundle-branch block, unspecified: Secondary | ICD-10-CM | POA: Diagnosis present

## 2022-02-07 DIAGNOSIS — F17210 Nicotine dependence, cigarettes, uncomplicated: Secondary | ICD-10-CM | POA: Diagnosis present

## 2022-02-07 DIAGNOSIS — Z885 Allergy status to narcotic agent status: Secondary | ICD-10-CM

## 2022-02-07 DIAGNOSIS — F32A Depression, unspecified: Secondary | ICD-10-CM | POA: Diagnosis present

## 2022-02-07 DIAGNOSIS — R7989 Other specified abnormal findings of blood chemistry: Secondary | ICD-10-CM | POA: Diagnosis present

## 2022-02-07 DIAGNOSIS — F19939 Other psychoactive substance use, unspecified with withdrawal, unspecified: Secondary | ICD-10-CM

## 2022-02-07 DIAGNOSIS — R269 Unspecified abnormalities of gait and mobility: Secondary | ICD-10-CM | POA: Diagnosis present

## 2022-02-07 DIAGNOSIS — Z9102 Food additives allergy status: Secondary | ICD-10-CM

## 2022-02-07 HISTORY — DX: Paroxysmal atrial fibrillation: I48.0

## 2022-02-07 LAB — URINALYSIS, ROUTINE W REFLEX MICROSCOPIC
Bacteria, UA: NONE SEEN
Bilirubin Urine: NEGATIVE
Glucose, UA: NEGATIVE mg/dL
Hgb urine dipstick: NEGATIVE
Ketones, ur: NEGATIVE mg/dL
Leukocytes,Ua: NEGATIVE
Nitrite: NEGATIVE
Protein, ur: 30 mg/dL — AB
Specific Gravity, Urine: 1.02 (ref 1.005–1.030)
pH: 6 (ref 5.0–8.0)

## 2022-02-07 LAB — CBC WITH DIFFERENTIAL/PLATELET
Abs Immature Granulocytes: 0.05 10*3/uL (ref 0.00–0.07)
Basophils Absolute: 0 10*3/uL (ref 0.0–0.1)
Basophils Relative: 0 %
Eosinophils Absolute: 0 10*3/uL (ref 0.0–0.5)
Eosinophils Relative: 0 %
HCT: 37.6 % — ABNORMAL LOW (ref 39.0–52.0)
Hemoglobin: 13.5 g/dL (ref 13.0–17.0)
Immature Granulocytes: 1 %
Lymphocytes Relative: 10 %
Lymphs Abs: 1 10*3/uL (ref 0.7–4.0)
MCH: 35.6 pg — ABNORMAL HIGH (ref 26.0–34.0)
MCHC: 35.9 g/dL (ref 30.0–36.0)
MCV: 99.2 fL (ref 80.0–100.0)
Monocytes Absolute: 1.1 10*3/uL — ABNORMAL HIGH (ref 0.1–1.0)
Monocytes Relative: 12 %
Neutro Abs: 7.2 10*3/uL (ref 1.7–7.7)
Neutrophils Relative %: 77 %
Platelets: 145 10*3/uL — ABNORMAL LOW (ref 150–400)
RBC: 3.79 MIL/uL — ABNORMAL LOW (ref 4.22–5.81)
RDW: 13.2 % (ref 11.5–15.5)
WBC: 9.4 10*3/uL (ref 4.0–10.5)
nRBC: 0 % (ref 0.0–0.2)

## 2022-02-07 LAB — RAPID URINE DRUG SCREEN, HOSP PERFORMED
Amphetamines: NOT DETECTED
Barbiturates: NOT DETECTED
Benzodiazepines: NOT DETECTED
Cocaine: NOT DETECTED
Opiates: NOT DETECTED
Tetrahydrocannabinol: NOT DETECTED

## 2022-02-07 LAB — COMPREHENSIVE METABOLIC PANEL
ALT: 66 U/L — ABNORMAL HIGH (ref 0–44)
AST: 124 U/L — ABNORMAL HIGH (ref 15–41)
Albumin: 3 g/dL — ABNORMAL LOW (ref 3.5–5.0)
Alkaline Phosphatase: 84 U/L (ref 38–126)
Anion gap: 11 (ref 5–15)
BUN: 10 mg/dL (ref 6–20)
CO2: 25 mmol/L (ref 22–32)
Calcium: 8.2 mg/dL — ABNORMAL LOW (ref 8.9–10.3)
Chloride: 96 mmol/L — ABNORMAL LOW (ref 98–111)
Creatinine, Ser: 1.03 mg/dL (ref 0.61–1.24)
GFR, Estimated: >60 mL/min (ref >60)
Glucose, Bld: 118 mg/dL — ABNORMAL HIGH (ref 70–99)
Potassium: 2.6 mmol/L — CL (ref 3.5–5.1)
Sodium: 132 mmol/L — ABNORMAL LOW (ref 135–145)
Total Bilirubin: 1.1 mg/dL (ref 0.3–1.2)
Total Protein: 5.7 g/dL — ABNORMAL LOW (ref 6.5–8.1)

## 2022-02-07 LAB — RESP PANEL BY RT-PCR (FLU A&B, COVID) ARPGX2
Influenza A by PCR: NEGATIVE
Influenza B by PCR: NEGATIVE
SARS Coronavirus 2 by RT PCR: NEGATIVE

## 2022-02-07 LAB — MAGNESIUM: Magnesium: 1.2 mg/dL — ABNORMAL LOW (ref 1.7–2.4)

## 2022-02-07 LAB — ETHANOL: Alcohol, Ethyl (B): <10 mg/dL (ref <10)

## 2022-02-07 LAB — LIPASE, BLOOD: Lipase: 31 U/L (ref 11–51)

## 2022-02-07 MED ORDER — SODIUM CHLORIDE 0.9 % IV BOLUS
1000.0000 mL | Freq: Once | INTRAVENOUS | Status: AC
  Administered 2022-02-07: 1000 mL via INTRAVENOUS

## 2022-02-07 MED ORDER — LORAZEPAM 1 MG PO TABS: 1.0000 mg | ORAL_TABLET | ORAL | Status: DC | PRN

## 2022-02-07 MED ORDER — LORAZEPAM 2 MG/ML IJ SOLN
2.0000 mg | Freq: Once | INTRAMUSCULAR | Status: AC
  Administered 2022-02-07: 2 mg via INTRAVENOUS
  Filled 2022-02-07: qty 1

## 2022-02-07 MED ORDER — THIAMINE HCL 100 MG/ML IJ SOLN: 100.0000 mg | Freq: Every day | INTRAMUSCULAR | Status: DC

## 2022-02-07 MED ORDER — ONDANSETRON HCL 4 MG/2ML IJ SOLN
4.0000 mg | Freq: Once | INTRAMUSCULAR | Status: AC
  Administered 2022-02-08: 4 mg via INTRAVENOUS
  Filled 2022-02-07: qty 2

## 2022-02-07 MED ORDER — ADULT MULTIVITAMIN W/MINERALS CH
1.0000 | ORAL_TABLET | Freq: Every day | ORAL | Status: DC
  Administered 2022-02-07 – 2022-02-11 (×5): 1 via ORAL
  Filled 2022-02-07 (×5): qty 1

## 2022-02-07 MED ORDER — MAGNESIUM SULFATE 2 GM/50ML IV SOLN
2.0000 g | Freq: Once | INTRAVENOUS | Status: AC
  Administered 2022-02-08: 2 g via INTRAVENOUS
  Filled 2022-02-07: qty 50

## 2022-02-07 MED ORDER — LORAZEPAM 2 MG/ML IJ SOLN
1.0000 mg | INTRAMUSCULAR | Status: DC | PRN
  Administered 2022-02-07: 2 mg via INTRAVENOUS
  Filled 2022-02-07 (×2): qty 1

## 2022-02-07 MED ORDER — THIAMINE MONONITRATE 100 MG PO TABS
100.0000 mg | ORAL_TABLET | Freq: Every day | ORAL | Status: DC
  Administered 2022-02-07: 100 mg via ORAL
  Filled 2022-02-07: qty 1

## 2022-02-07 MED ORDER — POTASSIUM CHLORIDE 10 MEQ/100ML IV SOLN
10.0000 meq | INTRAVENOUS | Status: AC
  Administered 2022-02-08 (×4): 10 meq via INTRAVENOUS
  Filled 2022-02-07 (×4): qty 100

## 2022-02-07 MED ORDER — FOLIC ACID 1 MG PO TABS
1.0000 mg | ORAL_TABLET | Freq: Every day | ORAL | Status: DC
  Administered 2022-02-07 – 2022-02-11 (×5): 1 mg via ORAL
  Filled 2022-02-07 (×5): qty 1

## 2022-02-07 NOTE — ED Triage Notes (Signed)
Pt bib [facility] from friends house, found by friend to be having seizure like activity with full body tremors and eyes rolling back. No hx seizures, hx alcoholism, reports reduced alcohol intake since staying with friend.  Axo x2 initially with EMS, now axo x4.   BP 122/78 HR 104 ST  SPO2 96% RA CBG 120

## 2022-02-07 NOTE — ED Notes (Signed)
Patient transported to CT 

## 2022-02-07 NOTE — ED Provider Notes (Signed)
[facility] EMERGENCY DEPARTMENT Provider Note   CSN: [id number] Arrival date & time: 02/07/22  1952     History  Chief Complaint  Patient presents with   Seizures    Matthew Shepard is a 56 y.o. male.  He has a history of alcohol abuse.  He has been trying to cut back on his drinking for the last few days.  He said he has been tremulous.  He was reported by EMS to be staying with some ?church friends sleeping on the couch when he began experiencing some shaking episode.  EMS that he was confused on their arrival and improving on arrival here.  Possible seizure.  Patient states he has never had a seizure before.  He endorses feeling shaky and he says he has a headache when he coughs.  He drinks about a pint of liquor a day.  The history is provided by the patient and the EMS personnel.  Seizures Seizure activity on arrival: no   Seizure type:  Unable to specify Initial focality:  Unable to specify Episode characteristics: generalized shaking   Postictal symptoms: confusion   Return to baseline: yes   Timing:  Once Context: alcohol withdrawal   Recent head injury:  No recent head injuries PTA treatment:  None History of seizures: no        Home Medications Prior to Admission medications   Medication Sig Start Date End Date Taking? Authorizing Provider  chlordiazePOXIDE (LIBRIUM) 25 MG capsule 50mg  PO TID x 1D, then 25-50mg  PO BID X 1D, then 25-50mg  PO QD X 1D 04/10/20   Horton, Barbette Hair, MD  gabapentin (NEURONTIN) 300 MG capsule Take 300 mg by mouth 3 (three) times daily. 04/17/19   [provider]  omeprazole (PRILOSEC) 20 MG capsule Take 1 capsule (20 mg total) by mouth daily. 03/10/14   Malvin Johns, MD  sertraline (ZOLOFT) 50 MG tablet Take by mouth. 04/17/19   [provider]      Allergies    Patient has no known allergies.    Review of Systems   Review of Systems  Constitutional:  Negative for fever.  Eyes:  Negative for  visual disturbance.  Respiratory:  Negative for shortness of breath.   Cardiovascular:  Negative for chest pain.  Gastrointestinal:  Negative for abdominal pain.  Neurological:  Positive for tremors and seizures.    Physical Exam Updated Vital Signs BP (!) 118/92   Pulse 90   Temp 98.7 F (37.1 C) (Oral)   Resp 16   SpO2 98%  Physical Exam Vitals and nursing note reviewed.  Constitutional:      General: He is not in acute distress.    Appearance: Normal appearance. He is well-developed.  HENT:     Head: Normocephalic and atraumatic.  Eyes:     Conjunctiva/sclera: Conjunctivae normal.  Cardiovascular:     Rate and Rhythm: Regular rhythm. Tachycardia present.     Heart sounds: No murmur heard. Pulmonary:     Effort: Pulmonary effort is normal. No respiratory distress.     Breath sounds: Normal breath sounds.  Abdominal:     Palpations: Abdomen is soft.     Tenderness: There is no abdominal tenderness.  Musculoskeletal:        General: No deformity. Normal range of motion.     Cervical back: Neck supple.  Skin:    General: Skin is warm and dry.     Capillary Refill: Capillary refill takes less than 2 seconds.  Neurological:     General: No focal deficit present.     Mental Status: He is alert.     ED Results / Procedures / Treatments   Labs (all labs ordered are listed, but only abnormal results are displayed) Labs Reviewed  CBC WITH DIFFERENTIAL/PLATELET - Abnormal; Notable for the following components:      Result Value   RBC 3.79 (*)    HCT 37.6 (*)    MCH 35.6 (*)    Platelets 145 (*)    Monocytes Absolute 1.1 (*)    All other components within normal limits  URINALYSIS, ROUTINE W REFLEX MICROSCOPIC - Abnormal; Notable for the following components:   Color, Urine AMBER (*)    APPearance HAZY (*)    Protein, ur 30 (*)    All other components within normal limits  COMPREHENSIVE METABOLIC PANEL - Abnormal; Notable for the following components:   Sodium 132  (*)    Potassium 2.6 (*)    Chloride 96 (*)    Glucose, Bld 118 (*)    Calcium 8.2 (*)    Total Protein 5.7 (*)    Albumin 3.0 (*)    AST 124 (*)    ALT 66 (*)    All other components within normal limits  MAGNESIUM - Abnormal; Notable for the following components:   Magnesium 1.2 (*)    All other components within normal limits  CBC - Abnormal; Notable for the following components:   RBC 3.46 (*)    Hemoglobin 12.3 (*)    HCT 34.9 (*)    MCV 100.9 (*)    MCH 35.5 (*)    Platelets 104 (*)    All other components within normal limits  BASIC METABOLIC PANEL - Abnormal; Notable for the following components:   Sodium 134 (*)    Potassium 3.0 (*)    Calcium 8.1 (*)    All other components within normal limits  RESP PANEL BY RT-PCR (FLU A&B, COVID) ARPGX2  ETHANOL  RAPID URINE DRUG SCREEN, HOSP PERFORMED  LIPASE, BLOOD  HIV ANTIBODY (ROUTINE TESTING W REFLEX)  MAGNESIUM    EKG EKG Interpretation  Date/Time:  Thursday February 07 2022 21:57:17 EST Ventricular Rate:  95 PR Interval:  170 QRS Duration: 163 QT Interval:  426 QTC Calculation: 536 R Axis:   12 Text Interpretation: Sinus rhythm Left bundle branch block No significant change since prior 1/22 Confirmed by Aletta Edouard [phone]) on 02/07/2022 9:58:47 PM  Radiology MR BRAIN WO CONTRAST  Result Date: 02/08/2022 CLINICAL DATA:  New onset seizure. EXAM: MRI HEAD WITHOUT CONTRAST TECHNIQUE: Multiplanar, multiecho pulse sequences of the brain and surrounding structures were obtained without intravenous contrast. COMPARISON:  CT head 1 day prior FINDINGS: Brain: There is no acute intracranial hemorrhage, extra-axial fluid collection, or acute infarct. There is mild parenchymal volume loss with prominence of the ventricular system and extra-axial CSF spaces, slightly advanced for age. Gray-white differentiation is preserved. There are a few small foci of of nonspecific FLAIR signal abnormality in the supratentorial white  matter and mild periventricular FLAIR signal abnormality which may reflect sequela of mild/early chronic small-vessel ischemic change. There is no suspicious parenchymal signal abnormality. There is no mass lesion. There is no mass effect or midline shift. The hippocampi are normal in signal and architecture. Vascular: Normal flow voids. Skull and upper cervical spine: Normal marrow signal. Sinuses/Orbits: There is moderate mucosal thickening in the right maxillary sinus. The globes and orbits are unremarkable. Other: None. IMPRESSION: No  acute intracranial pathology or epileptogenic focus identified. Electronically Signed   By: Valetta Mole M.D.   On: 02/08/2022 10:29   CT Head Wo Contrast  Result Date: 02/07/2022 CLINICAL DATA:  Seizure, new-onset, no history of trauma EXAM: CT HEAD WITHOUT CONTRAST TECHNIQUE: Contiguous axial images were obtained from the base of the skull through the vertex without intravenous contrast. RADIATION DOSE REDUCTION: This exam was performed according to the departmental dose-optimization program which includes automated exposure control, adjustment of the mA and/or kV according to patient size and/or use of iterative reconstruction technique. COMPARISON:  CT head 09/10/2018 FINDINGS: Brain: No evidence of large-territorial acute infarction. No parenchymal hemorrhage. No mass lesion. No extra-axial collection. No mass effect or midline shift. No hydrocephalus. Basilar cisterns are patent. Vascular: No hyperdense vessel. Skull: No acute fracture or focal lesion. Sinuses/Orbits: Almost complete opacification of the right maxillary sinus. Associated mild hyper trophy of the sinus wall consistent with chronic sinus disease. Paranasal sinuses and mastoid air cells are clear. The orbits are unremarkable. Other: None. IMPRESSION: No acute intracranial abnormality. Electronically Signed   By: Iven Finn M.D.   On: 02/07/2022 23:01   DG Chest Port 1 View  Result Date:  02/07/2022 CLINICAL DATA:  Seizure-like activity. EXAM: PORTABLE CHEST 1 VIEW COMPARISON:  November 03, 2020 FINDINGS: The heart size and mediastinal contours are within normal limits. There is no evidence of an acute infiltrate, pleural effusion or pneumothorax. The visualized skeletal structures are unremarkable. IMPRESSION: No active cardiopulmonary disease. Electronically Signed   By: Virgina Norfolk M.D.   On: 02/07/2022 21:04    Procedures Procedures    Medications Ordered in ED Medications  folic acid (FOLVITE) tablet 1 mg (1 mg Oral Given 02/08/22 0926)  multivitamin with minerals tablet 1 tablet (1 tablet Oral Given 02/08/22 0926)  enoxaparin (LOVENOX) injection 40 mg (40 mg Subcutaneous Given 02/08/22 0926)  LORazepam (ATIVAN) injection 2 mg (2 mg Intravenous Given 02/08/22 0320)  hydrOXYzine (ATARAX) tablet 25 mg (has no administration in time range)  loperamide (IMODIUM) capsule 2-4 mg (has no administration in time range)  ondansetron (ZOFRAN-ODT) disintegrating tablet 4 mg (has no administration in time range)  thiamine (VITAMIN B1) 500 mg in normal saline (50 mL) IVPB (500 mg Intravenous New Bag/Given 02/08/22 0927)    Followed by  thiamine (VITAMIN B1) 250 mg in sodium chloride 0.9 % 50 mL IVPB (has no administration in time range)    Followed by  thiamine (VITAMIN B1) tablet 100 mg (has no administration in time range)  LORazepam (ATIVAN) tablet 1-4 mg (has no administration in time range)    Or  LORazepam (ATIVAN) injection 1-4 mg (has no administration in time range)  LORazepam (ATIVAN) injection 0-4 mg (0 mg Intravenous Hold 02/08/22 0926)    Followed by  LORazepam (ATIVAN) injection 0-4 mg (has no administration in time range)  potassium chloride SA (KLOR-CON M) CR tablet 40 mEq (40 mEq Oral Given 02/08/22 1045)  PHENobarbital (LUMINAL) tablet 97.2 mg (has no administration in time range)    Followed by  phenobarbital (LUMINAL) tablet 64.8 mg (has no administration in time  range)    Followed by  phenobarbital (LUMINAL) tablet 32.4 mg (has no administration in time range)  LORazepam (ATIVAN) injection 2 mg (2 mg Intravenous Given 02/07/22 2025)  sodium chloride 0.9 % bolus 1,000 mL (0 mLs Intravenous Stopped 02/07/22 2203)  ondansetron (ZOFRAN) injection 4 mg (4 mg Intravenous Given 02/08/22 0011)  potassium chloride 10 mEq in 100 mL  IVPB (0 mEq Intravenous Stopped 02/08/22 0522)  magnesium sulfate IVPB 2 g 50 mL (0 g Intravenous Stopped 02/08/22 0120)  magnesium sulfate IVPB 2 g 50 mL (0 g Intravenous Stopped 02/08/22 0406)    ED Course/ Medical Decision Making/ A&P Clinical Course as of 02/08/22 1103  Thu Feb 07, 2022  2107 Chest x-ray interpreted by me as no definite infiltrate.  Awaiting radiology reading. [MB]  2150 Informed by nurse that patient was still tremulous and she gave 2 more milligrams of Ativan.  He is also vomited once given Zofran. [MB]  2359 His wife is here.  She said he has been drinking and has not really been eating much.  Has nausea and vomiting.  She has been trying to give him boost without much improvement.  I let her know that he needs to be admitted to the hospital for further management [MB]  Fri Feb 08, 2022  0021 Discussed with [facility] hospitalist Dr. Margo Aye who will evaluate patient for admission [MB]    Clinical Course User Index [MB] Terrilee Files, MD                           Medical Decision Making Amount and/or Complexity of Data Reviewed Labs: ordered. Radiology: ordered.  Risk Prescription drug management. Decision regarding hospitalization.   This patient complains of seizure or tremors nausea vomiting headache; this involves an extensive number of treatment Options and is a complaint that carries with it a high risk of complications and morbidity. The differential includes alcohol withdrawal, seizure, metabolic derangement, dehydration  I ordered, reviewed and interpreted labs, which included CBC with normal  white count, hemoglobin and platelets low, chemistries with low potassium low magnesium, elevated LFTs, urinalysis without signs of infection, alcohol level negative, COVID and flu negative I ordered medication IV fluids IV thiamine multivitamins folic acid and Ativan for withdrawal, repletion of magnesium and potassium and reviewed PMP when indicated. I ordered imaging studies which included chest x-ray and CT head and I independently    visualized and interpreted imaging which showed no acute findings Additional history obtained from EMS and patient's wife Previous records obtained and reviewed in epic no recent admissions I consulted [facility] hospitalist Dr. Margo Aye and discussed lab and imaging findings and discussed disposition.  Cardiac monitoring reviewed, normal sinus rhythm Social determinants considered, ongoing tobacco and alcohol use Critical Interventions: None  After the interventions stated above, I reevaluated the patient and found patient still to be somnolent and not appropriate for discharge Admission and further testing considered, he would benefit from mission to the hospital for continued electrolyte repletion and management of his alcohol withdrawal symptoms.         Final Clinical Impression(s) / ED Diagnoses Final diagnoses:  Hypomagnesemia  Hypokalemia  Elevated LFTs  Alcohol withdrawal syndrome without complication (HCC)  Alcohol withdrawal seizure without complication [facility])    Rx / DC Orders ED Discharge Orders     None         Terrilee Files, MD 02/08/22 [phone]

## 2022-02-07 NOTE — ED Notes (Signed)
Charm Barges MD made aware of potassium 2.6

## 2022-02-08 ENCOUNTER — Encounter (HOSPITAL_COMMUNITY): Payer: Self-pay | Admitting: Internal Medicine

## 2022-02-08 ENCOUNTER — Inpatient Hospital Stay (HOSPITAL_COMMUNITY): Payer: Self-pay

## 2022-02-08 DIAGNOSIS — F19939 Other psychoactive substance use, unspecified with withdrawal, unspecified: Secondary | ICD-10-CM

## 2022-02-08 DIAGNOSIS — F10931 Alcohol use, unspecified with withdrawal delirium: Secondary | ICD-10-CM

## 2022-02-08 DIAGNOSIS — R569 Unspecified convulsions: Secondary | ICD-10-CM

## 2022-02-08 LAB — CBC
HCT: 34.9 % — ABNORMAL LOW (ref 39.0–52.0)
Hemoglobin: 12.3 g/dL — ABNORMAL LOW (ref 13.0–17.0)
MCH: 35.5 pg — ABNORMAL HIGH (ref 26.0–34.0)
MCHC: 35.2 g/dL (ref 30.0–36.0)
MCV: 100.9 fL — ABNORMAL HIGH (ref 80.0–100.0)
Platelets: 104 10*3/uL — ABNORMAL LOW (ref 150–400)
RBC: 3.46 MIL/uL — ABNORMAL LOW (ref 4.22–5.81)
RDW: 13.2 % (ref 11.5–15.5)
WBC: 6.5 10*3/uL (ref 4.0–10.5)
nRBC: 0 % (ref 0.0–0.2)

## 2022-02-08 LAB — BASIC METABOLIC PANEL
Anion gap: 7 (ref 5–15)
BUN: 9 mg/dL (ref 6–20)
CO2: 24 mmol/L (ref 22–32)
Calcium: 8.1 mg/dL — ABNORMAL LOW (ref 8.9–10.3)
Chloride: 103 mmol/L (ref 98–111)
Creatinine, Ser: 0.83 mg/dL (ref 0.61–1.24)
GFR, Estimated: >60 mL/min (ref >60)
Glucose, Bld: 90 mg/dL (ref 70–99)
Potassium: 3 mmol/L — ABNORMAL LOW (ref 3.5–5.1)
Sodium: 134 mmol/L — ABNORMAL LOW (ref 135–145)

## 2022-02-08 LAB — HIV ANTIBODY (ROUTINE TESTING W REFLEX): HIV Screen 4th Generation wRfx: NONREACTIVE

## 2022-02-08 LAB — MAGNESIUM: Magnesium: 2.1 mg/dL (ref 1.7–2.4)

## 2022-02-08 MED ORDER — MAGNESIUM SULFATE 2 GM/50ML IV SOLN
2.0000 g | Freq: Once | INTRAVENOUS | Status: AC
  Administered 2022-02-08: 2 g via INTRAVENOUS
  Filled 2022-02-08: qty 50

## 2022-02-08 MED ORDER — HYDROXYZINE HCL 25 MG PO TABS
25.0000 mg | ORAL_TABLET | Freq: Four times a day (QID) | ORAL | Status: AC | PRN
  Administered 2022-02-10 (×3): 25 mg via ORAL
  Filled 2022-02-08 (×3): qty 1

## 2022-02-08 MED ORDER — LORAZEPAM 2 MG/ML IJ SOLN
2.0000 mg | Freq: Four times a day (QID) | INTRAMUSCULAR | Status: DC | PRN
  Administered 2022-02-08: 2 mg via INTRAVENOUS

## 2022-02-08 MED ORDER — LORAZEPAM 1 MG PO TABS: 1.0000 mg | ORAL_TABLET | ORAL | Status: AC | PRN

## 2022-02-08 MED ORDER — LOPERAMIDE HCL 2 MG PO CAPS: 2.0000 mg | ORAL_CAPSULE | ORAL | Status: AC | PRN

## 2022-02-08 MED ORDER — PHENOBARBITAL 32.4 MG PO TABS: 32.4000 mg | ORAL_TABLET | Freq: Three times a day (TID) | ORAL | Status: DC

## 2022-02-08 MED ORDER — PHENOBARBITAL 32.4 MG PO TABS
64.8000 mg | ORAL_TABLET | Freq: Three times a day (TID) | ORAL | Status: DC
  Administered 2022-02-10: 64.8 mg via ORAL
  Filled 2022-02-08: qty 2

## 2022-02-08 MED ORDER — THIAMINE MONONITRATE 100 MG PO TABS: 100.0000 mg | ORAL_TABLET | Freq: Every day | ORAL | Status: DC

## 2022-02-08 MED ORDER — THIAMINE HCL 100 MG/ML IJ SOLN
250.0000 mg | INTRAVENOUS | Status: DC
  Filled 2022-02-08: qty 2.5

## 2022-02-08 MED ORDER — ENOXAPARIN SODIUM 40 MG/0.4ML IJ SOSY
40.0000 mg | PREFILLED_SYRINGE | Freq: Every day | INTRAMUSCULAR | Status: DC
  Administered 2022-02-08 – 2022-02-11 (×4): 40 mg via SUBCUTANEOUS
  Filled 2022-02-08 (×4): qty 0.4

## 2022-02-08 MED ORDER — LORAZEPAM 2 MG/ML IJ SOLN
0.0000 mg | Freq: Four times a day (QID) | INTRAMUSCULAR | Status: AC
  Filled 2022-02-08: qty 1

## 2022-02-08 MED ORDER — THIAMINE HCL 100 MG/ML IJ SOLN
500.0000 mg | Freq: Three times a day (TID) | INTRAVENOUS | Status: AC
  Administered 2022-02-08 – 2022-02-10 (×9): 500 mg via INTRAVENOUS
  Filled 2022-02-08 (×9): qty 5

## 2022-02-08 MED ORDER — ONDANSETRON 4 MG PO TBDP: 4.0000 mg | ORAL_TABLET | Freq: Four times a day (QID) | ORAL | Status: AC | PRN

## 2022-02-08 MED ORDER — CHLORDIAZEPOXIDE HCL 25 MG PO CAPS: 25.0000 mg | ORAL_CAPSULE | Freq: Every day | ORAL | Status: DC

## 2022-02-08 MED ORDER — NICOTINE POLACRILEX 2 MG MT GUM: 2.0000 mg | CHEWING_GUM | OROMUCOSAL | Status: DC | PRN

## 2022-02-08 MED ORDER — ADULT MULTIVITAMIN W/MINERALS CH: 1.0000 | ORAL_TABLET | Freq: Every day | ORAL | Status: DC

## 2022-02-08 MED ORDER — LORAZEPAM 2 MG/ML IJ SOLN: 1.0000 mg | INTRAMUSCULAR | Status: AC | PRN

## 2022-02-08 MED ORDER — CHLORDIAZEPOXIDE HCL 25 MG PO CAPS: 25.0000 mg | ORAL_CAPSULE | Freq: Four times a day (QID) | ORAL | Status: DC | PRN

## 2022-02-08 MED ORDER — CHLORDIAZEPOXIDE HCL 25 MG PO CAPS
25.0000 mg | ORAL_CAPSULE | Freq: Four times a day (QID) | ORAL | Status: DC
  Filled 2022-02-08: qty 1

## 2022-02-08 MED ORDER — CHLORDIAZEPOXIDE HCL 25 MG PO CAPS: 25.0000 mg | ORAL_CAPSULE | ORAL | Status: DC

## 2022-02-08 MED ORDER — FOLIC ACID 1 MG PO TABS: 1.0000 mg | ORAL_TABLET | Freq: Every day | ORAL | Status: DC

## 2022-02-08 MED ORDER — CHLORDIAZEPOXIDE HCL 25 MG PO CAPS: 25.0000 mg | ORAL_CAPSULE | Freq: Three times a day (TID) | ORAL | Status: DC

## 2022-02-08 MED ORDER — POTASSIUM CHLORIDE CRYS ER 20 MEQ PO TBCR
40.0000 meq | EXTENDED_RELEASE_TABLET | ORAL | Status: AC
  Administered 2022-02-08: 40 meq via ORAL
  Filled 2022-02-08 (×2): qty 2

## 2022-02-08 MED ORDER — LORAZEPAM 2 MG/ML IJ SOLN: 0.0000 mg | Freq: Two times a day (BID) | INTRAMUSCULAR | Status: DC

## 2022-02-08 MED ORDER — PHENOBARBITAL 97.2 MG PO TABS
97.2000 mg | ORAL_TABLET | Freq: Three times a day (TID) | ORAL | Status: AC
  Administered 2022-02-08 – 2022-02-10 (×5): 97.2 mg via ORAL
  Filled 2022-02-08 (×6): qty 1

## 2022-02-08 MED ORDER — NICOTINE 21 MG/24HR TD PT24
21.0000 mg | MEDICATED_PATCH | Freq: Every day | TRANSDERMAL | Status: DC
  Administered 2022-02-08 – 2022-02-11 (×4): 21 mg via TRANSDERMAL
  Filled 2022-02-08 (×4): qty 1

## 2022-02-08 NOTE — Procedures (Signed)
Patient Name: Matthew Shepard  MRN: [id number]  Epilepsy Attending: [name]  Referring Physician/Provider: Zigmund Daniel., MD  Date: 02/08/2022 Duration: 21.08 mins  Patient history: 56yo M with alcohol withdrawal seizure. EEG to evaluate for seizure.  Level of alertness:  lethargic   AEDs during EEG study: Ativan, Phenobarb  Technical aspects: This EEG study was done with scalp electrodes positioned according to the 10-20 International system of electrode placement. Electrical activity was reviewed with band pass filter of 1-70Hz , sensitivity of 7 uV/mm, display speed of 57mm/sec with a 60Hz  notched filter applied as appropriate. EEG data were recorded continuously and digitally stored.  Video monitoring was available and reviewed as appropriate.  Description: EEG showed an excessive amount of 15 to 18 Hz beta activity distributed symmetrically and diffusely. Hyperventilation and photic stimulation were not performed.     ABNORMALITY - Excessive beta, generalized  IMPRESSION: This study is within normal limits. The excessive beta activity seen in the background is most likely due to the effect of benzodiazepine and is a benign EEG pattern. No seizures or epileptiform discharges were seen throughout the recording.  Asja Frommer 

## 2022-02-08 NOTE — Progress Notes (Signed)
Need diet consult. Last weight recorded was 2 years ago and he's down 22%. Pt's wife stated they've been separated for 2 years and when she's not around he doesn't eat or take his meds. All he does is drink Alcohol and eat Chicken and Noodles once in awhile. Pt ate a tv dinner without issues once on the unit.

## 2022-02-08 NOTE — ED Notes (Signed)
Pt placed on bedpan for BM.

## 2022-02-08 NOTE — ED Notes (Signed)
Pt transported to MRI 

## 2022-02-08 NOTE — ED Notes (Signed)
Family at bedside. 

## 2022-02-08 NOTE — Progress Notes (Addendum)
Wife and pt state he isn't allergic to PCN the allergy belongs to the wife.  They both say all allergies belong to the wife not the pt

## 2022-02-08 NOTE — Progress Notes (Signed)
PROGRESS NOTE    Matthew Shepard  RSW:[id number] DOB: 05-15-1965 DOA: 02/07/2022 PCP: Patient, No Pcp Per  Chief Complaint  Patient presents with   Seizures    Brief Narrative:  Matthew Shepard is Matthew Shepard 56 y.o. male with medical history significant for alcohol abuse, chronic anxiety/depression, GERD, polyneuropathy, who presented to [facility] ED from home due to witnessed seizures.  The patient drinks alcohol heavily 1 Shepard/Shepard.  He tried to cut down on drinking for the past few days.  He had Matthew Shepard dentist appointment and was told he needed to cut down on drinking, was prescribed amoxicillin 3 days ago by his dentist.  Has been having intermittent nausea and vomiting.  Has been unable to keep anything down for the past week.  Last alcohol intake was yesterday morning but vomited     This morning, while on the couch he was noted to have tonic-clonic seizure.  EMS was activated.  Upon EMS arrival he was postictal.  He was brought into the ED for further evaluation.   In the ED, postictal and lethargic.  Head CT nonacute.  He was started on multivitamins, IV thiamine and folic acid and CIWA protocol.  He also had some electrolyte derangement which are being repleted.  TRH, hospitalist service, was asked to admit.   ED Course: Tmax 100.5.  BP 105/76, pulse 98, respiration rate 16, O2 saturation 93% on room air.  Lab studies remarkable for serum sodium 132, potassium 2.6, glucose 118, AST 124, ALT 66, platelet 145.   Assessment & Plan:   Principal Problem:   Withdrawal seizures (HCC)  Complicated Etoh Withdrawal  Alcohol Withdrawal Seizures Might benefit from phenobarbital, will discuss with pharmacy Scheduled librium taper.  Ativan prn. Follow CIWA EEG, MRI  High dose thiamine.  Folate, MVI. Consider discussion of meds prior to discharge Seizure precautions  Per Matthew Shepard, patients with seizures are not allowed to drive until  they have been seizure-free for six months. Use caution  when using heavy equipment or power tools. Avoid working on ladders or at heights. Take showers instead of baths. Ensure the water temperature is not too high on the home water heater. Do not go swimming alone. When caring for infants or small children, sit down when holding, feeding, or changing them to minimize risk of injury to the child in the event you have Matthew Shepard seizure. Also, Maintain good sleep hygiene. Avoid alcohol.   Gait Abnormality He's unable to describe this well due to sedation, notes issues with gait over the past few days Symmetric strength to lower extremities on exam Follow MRI Possibly related to chronic etoh use, wernickes? High dose thiamine as above. PT/OT  SIRS Fever, tachycardia, tachypnea  CXR without acute cardiopulm dz UA not concerning for UTI Follow, consider blood cultures if recurrent fevers  Elevated LFT's Related to etoh use Follow INR  Thrombocytopenia Related to etoh use, trend  Prolonged Qtc Replace K and mag Follow repeat  Hypokalemia Hypomagnesemia Replace and follow  Polyneuropathy Hold gabapentin for now with mild encephalopathy  Chronic Anxiety and Depression Resume zoloft once he's improved    DVT prophylaxis: lovenox Code Status: full Family Communication: daughter, family at bedside Disposition:   Status is: Inpatient Remains inpatient appropriate because: complicated withdrawal   Consultants:  none  Procedures:  none  Antimicrobials:  Anti-infectives (From admission, onward)    None       Subjective: Says he cut back on etoh due to "life" Drank Matthew Shepard  Matthew Shepard, cut down to Matthew Shepard Says last drink was yesterday?  Objective: Vitals:   02/08/22 0731 02/08/22 0732 02/08/22 0800 02/08/22 0900  BP:  112/76 102/76 111/75  Pulse:  77 80 67  Resp:  18 16 12   Temp:    98.7 F (37.1 C)  TempSrc:    Oral  SpO2: (!) 88% 96% 97% 97%    Intake/Output Summary (Last 24 hours) at 02/08/2022 0915 Last data filed at  02/08/2022 0522 Gross per 24 hour  Intake 1499.71 ml  Output --  Net 1499.71 ml   There were no vitals filed for this visit.  Examination:  General exam: Appears calm and comfortable  Respiratory system: unlabored Cardiovascular system: RRR Gastrointestinal system: Abdomen is nondistended, soft and nontender Central nervous system: Alert.  Moving all extremities with equal strength. No focal neurological deficits. Extremities: no LEE    Data Reviewed: I have personally reviewed following labs and imaging studies  CBC: Recent Labs  Lab 02/07/22 2031 02/08/22 0250  WBC 9.4 6.5  NEUTROABS 7.2  --   HGB 13.5 12.3*  HCT 37.6* 34.9*  MCV 99.2 100.9*  PLT 145* 104*    Basic Metabolic Panel: Recent Labs  Lab 02/07/22 2200 02/08/22 0830  NA 132* 134*  K 2.6* 3.0*  CL 96* 103  CO2 25 24  GLUCOSE 118* 90  BUN 10 9  CREATININE 1.03 0.83  CALCIUM 8.2* 8.1*  MG 1.2* 2.1    GFR: CrCl cannot be calculated (Unknown ideal weight.).  Liver Function Tests: Recent Labs  Lab 02/07/22 2200  AST 124*  ALT 66*  ALKPHOS 84  BILITOT 1.1  PROT 5.7*  ALBUMIN 3.0*    CBG: No results for input(s): "GLUCAP" in the last 168 hours.   Recent Results (from the past 240 hour(s))  Resp Panel by RT-PCR (Flu Matthew Shepard&B, Covid) Anterior Nasal Swab     Status: None   Collection Time: 02/07/22  8:14 PM   Specimen: Anterior Nasal Swab  Result Value Ref Range Status   SARS Coronavirus 2 by RT PCR NEGATIVE NEGATIVE Final    Comment: (NOTE) SARS-CoV-2 target nucleic acids are NOT DETECTED.  The SARS-CoV-2 RNA is generally detectable in upper respiratory specimens during the acute phase of infection. The lowest concentration of SARS-CoV-2 viral copies this assay can detect is 138 copies/mL. Matthew Shepard negative result does not preclude SARS-Cov-2 infection and should not be used as the sole basis for treatment or other patient management decisions. Matthew Shepard negative result may occur with  improper  specimen collection/handling, submission of specimen other than nasopharyngeal swab, presence of viral mutation(s) within the areas targeted by this assay, and inadequate number of viral copies(<138 copies/mL). Ma Munoz negative result must be combined with clinical observations, patient history, and epidemiological information. The expected result is Negative.  Fact Sheet for Patients:  EntrepreneurPulse.com.au  Fact Sheet for Healthcare Providers:  IncredibleEmployment.be  This test is no t yet approved or cleared by the [place] FDA and  has been authorized for detection and/or diagnosis of SARS-CoV-2 by FDA under an Emergency Use Authorization (EUA). This EUA will remain  in effect (meaning this test can be used) for the duration of the COVID-19 declaration under Section 564(b)(1) of the Act, 21 U.S.C.section 360bbb-3(b)(1), unless the authorization is terminated  or revoked sooner.       Influenza Makyi Ledo by PCR NEGATIVE NEGATIVE Final   Influenza B by PCR NEGATIVE NEGATIVE Final    Comment: (NOTE) The Xpert Xpress SARS-CoV-2/FLU/RSV  plus assay is intended as an aid in the diagnosis of influenza from Nasopharyngeal swab specimens and should not be used as Lamir Racca sole basis for treatment. Nasal washings and aspirates are unacceptable for Xpert Xpress SARS-CoV-2/FLU/RSV testing.  Fact Sheet for Patients: BloggerCourse.com  Fact Sheet for Healthcare Providers: SeriousBroker.it  This test is not yet approved or cleared by the [place] FDA and has been authorized for detection and/or diagnosis of SARS-CoV-2 by FDA under an Emergency Use Authorization (EUA). This EUA will remain in effect (meaning this test can be used) for the duration of the COVID-19 declaration under Section 564(b)(1) of the Act, 21 U.S.C. section 360bbb-3(b)(1), unless the authorization is terminated or revoked.  Performed at  [facility], [address], [place], [place] [address]          Radiology Studies: CT Head Wo Contrast  Result Date: 02/07/2022 CLINICAL DATA:  Seizure, new-onset, no history of trauma EXAM: CT HEAD WITHOUT CONTRAST TECHNIQUE: Contiguous axial images were obtained from the base of the skull through the vertex without intravenous contrast. RADIATION DOSE REDUCTION: This exam was performed according to the departmental dose-optimization program which includes automated exposure control, adjustment of the mA and/or kV according to patient size and/or use of iterative reconstruction technique. COMPARISON:  CT head 09/10/2018 FINDINGS: Brain: No evidence of large-territorial acute infarction. No parenchymal hemorrhage. No mass lesion. No extra-axial collection. No mass effect or midline shift. No hydrocephalus. Basilar cisterns are patent. Vascular: No hyperdense vessel. Skull: No acute fracture or focal lesion. Sinuses/Orbits: Almost complete opacification of the right maxillary sinus. Associated mild hyper trophy of the sinus wall consistent with chronic sinus disease. Paranasal sinuses and mastoid air cells are clear. The orbits are unremarkable. Other: None. IMPRESSION: No acute intracranial abnormality. Electronically Signed   By: Tish Frederickson M.D.   On: 02/07/2022 23:01   DG Chest Port 1 View  Result Date: 02/07/2022 CLINICAL DATA:  Seizure-like activity. EXAM: PORTABLE CHEST 1 VIEW COMPARISON:  November 03, 2020 FINDINGS: The heart size and mediastinal contours are within normal limits. There is no evidence of an acute infiltrate, pleural effusion or pneumothorax. The visualized skeletal structures are unremarkable. IMPRESSION: No active cardiopulmonary disease. Electronically Signed   By: Aram Candela M.D.   On: 02/07/2022 21:04        Scheduled Meds:  chlordiazePOXIDE  25 mg Oral QID   Followed by   Melene Muller ON 02/09/2022] chlordiazePOXIDE  25 mg Oral TID   Followed by    Melene Muller ON 02/10/2022] chlordiazePOXIDE  25 mg Oral BH-qamhs   Followed by   Melene Muller ON 02/11/2022] chlordiazePOXIDE  25 mg Oral Daily   enoxaparin (LOVENOX) injection  40 mg Subcutaneous Daily   folic acid  1 mg Oral Daily   LORazepam  0-4 mg Intravenous Q6H   Followed by   Melene Muller ON 02/10/2022] LORazepam  0-4 mg Intravenous Q12H   multivitamin with minerals  1 tablet Oral Daily   potassium chloride  40 mEq Oral Q4H   [START ON 02/16/2022] thiamine  100 mg Oral Daily   Continuous Infusions:  thiamine (VITAMIN B1) injection     Followed by   Melene Muller ON 02/11/2022] thiamine (VITAMIN B1) injection       LOS: 0 days    Time spent: over 30 min    Lacretia Nicks, MD [facility]   To contact the attending provider between 7A-7P or the covering provider during after hours 7P-7A, please log into the web site [email or web address]  and access using universal  password for that web site. If you do not have the password, please call the hospital operator.  02/08/2022, 9:15 AM

## 2022-02-08 NOTE — ED Notes (Signed)
Pts daughter updated by this RN regarding plan of care. Pt also spoke with daughter regarding same via telephone

## 2022-02-08 NOTE — Progress Notes (Signed)
EEG complete - results pending 

## 2022-02-08 NOTE — H&P (Addendum)
History and Physical  Matthew Shepard [id number] DOB: 1965/03/17 DOA: 02/07/2022  Referring physician: Dr. Melina Copa, [place]  PCP: Patient, No Pcp Per  Outpatient Specialists: None Patient coming from: Home  Chief Complaint: Witnessed seizures  HPI: Matthew Shepard is a 56 y.o. male with medical history significant for alcohol abuse, chronic anxiety/depression, GERD, polyneuropathy, who presented to [facility] ED from home due to witnessed seizures.  The patient drinks alcohol heavily 1 pint/day.  He tried to cut down on drinking for the past few days.  He had a dentist appointment and was told he needed to cut down on drinking, was prescribed amoxicillin 3 days ago by his dentist.  Has been having intermittent nausea and vomiting.  Has been unable to keep anything down for the past week.  Last alcohol intake was yesterday morning but vomited    This morning, while on the couch he was noted to have tonic-clonic seizure.  EMS was activated.  Upon EMS arrival he was postictal.  He was brought into the ED for further evaluation.  In the ED, postictal and lethargic.  Head CT nonacute.  He was started on multivitamins, IV thiamine and folic acid and CIWA protocol.  He also had some electrolyte derangement which are being repleted.  TRH, hospitalist service, was asked to admit.  ED Course: Tmax 100.5.  BP 105/76, pulse 98, respiration rate 16, O2 saturation 93% on room air.  Lab studies remarkable for serum sodium 132, potassium 2.6, glucose 118, AST 124, ALT 66, platelet 145.  Review of Systems: Review of systems as noted in the HPI. All other systems reviewed and are negative.   Past Medical History:  Diagnosis Date   Anxiety    Back pain    No past surgical history on file.  Social History:  reports that he has been smoking. He has been smoking an average of 1 pack per day. He has never used smokeless tobacco. He reports current alcohol use. He reports that he does not use drugs.   No Known  Allergies  Family history: Unable to obtain this history as the patient is postictal.   Prior to Admission medications   Medication Sig Start Date End Date Taking? Authorizing Provider  chlordiazePOXIDE (LIBRIUM) 25 MG capsule 50mg  PO TID x 1D, then 25-50mg  PO BID X 1D, then 25-50mg  PO QD X 1D 04/10/20   Horton, Barbette Hair, MD  gabapentin (NEURONTIN) 300 MG capsule Take 300 mg by mouth 3 (three) times daily. 04/17/19   [provider]  omeprazole (PRILOSEC) 20 MG capsule Take 1 capsule (20 mg total) by mouth daily. 03/10/14   Malvin Johns, MD  sertraline (ZOLOFT) 50 MG tablet Take by mouth. 04/17/19   [provider]    Physical Exam: BP 106/72   Pulse 97   Temp 100.2 F (37.9 C) (Oral)   Resp 18   SpO2 92%   General: 56 y.o. year-old male well developed well nourished in no acute distress.  Postictal. Cardiovascular: Regular rate and rhythm with no rubs or gallops.  No thyromegaly or JVD noted.  No lower extremity edema. 2/4 pulses in all 4 extremities. Respiratory: Clear to auscultation with no wheezes or rales. Good inspiratory effort. Abdomen: Soft nontender nondistended with normal bowel sounds x4 quadrants. Muskuloskeletal: No cyanosis, clubbing or edema noted bilaterally Neuro: CN II-XII intact, strength, sensation, reflexes Skin: No ulcerative lesions noted or rashes Psychiatry: Judgement and insight appear altered.  Labs on Admission:  Basic Metabolic Panel: Recent Labs  Lab 02/07/22 2200  NA 132*  K 2.6*  CL 96*  CO2 25  GLUCOSE 118*  BUN 10  CREATININE 1.03  CALCIUM 8.2*  MG 1.2*   Liver Function Tests: Recent Labs  Lab 02/07/22 2200  AST 124*  ALT 66*  ALKPHOS 84  BILITOT 1.1  PROT 5.7*  ALBUMIN 3.0*   Recent Labs  Lab 02/07/22 2200  LIPASE 31   No results for input(s): "AMMONIA" in the last 168 hours. CBC: Recent Labs  Lab 02/07/22 2031  WBC 9.4  NEUTROABS 7.2  HGB 13.5  HCT 37.6*  MCV 99.2  PLT 145*    Cardiac Enzymes: No results for input(s): "CKTOTAL", "CKMB", "CKMBINDEX", "TROPONINI" in the last 168 hours.  BNP (last 3 results) No results for input(s): "BNP" in the last 8760 hours.  ProBNP (last 3 results) No results for input(s): "PROBNP" in the last 8760 hours.  CBG: No results for input(s): "GLUCAP" in the last 168 hours.  Radiological Exams on Admission: CT Head Wo Contrast  Result Date: 02/07/2022 CLINICAL DATA:  Seizure, new-onset, no history of trauma EXAM: CT HEAD WITHOUT CONTRAST TECHNIQUE: Contiguous axial images were obtained from the base of the skull through the vertex without intravenous contrast. RADIATION DOSE REDUCTION: This exam was performed according to the departmental dose-optimization program which includes automated exposure control, adjustment of the mA and/or kV according to patient size and/or use of iterative reconstruction technique. COMPARISON:  CT head 09/10/2018 FINDINGS: Brain: No evidence of large-territorial acute infarction. No parenchymal hemorrhage. No mass lesion. No extra-axial collection. No mass effect or midline shift. No hydrocephalus. Basilar cisterns are patent. Vascular: No hyperdense vessel. Skull: No acute fracture or focal lesion. Sinuses/Orbits: Almost complete opacification of the right maxillary sinus. Associated mild hyper trophy of the sinus wall consistent with chronic sinus disease. Paranasal sinuses and mastoid air cells are clear. The orbits are unremarkable. Other: None. IMPRESSION: No acute intracranial abnormality. Electronically Signed   By: Iven Finn M.D.   On: 02/07/2022 23:01   DG Chest Port 1 View  Result Date: 02/07/2022 CLINICAL DATA:  Seizure-like activity. EXAM: PORTABLE CHEST 1 VIEW COMPARISON:  November 03, 2020 FINDINGS: The heart size and mediastinal contours are within normal limits. There is no evidence of an acute infiltrate, pleural effusion or pneumothorax. The visualized skeletal structures are  unremarkable. IMPRESSION: No active cardiopulmonary disease. Electronically Signed   By: Virgina Norfolk M.D.   On: 02/07/2022 21:04    EKG: I independently viewed the EKG done and my findings are as followed: Sinus rhythm rate of 95.  Nonspecific ST-T changes.  QTc 536.  LBBB.  Assessment/Plan Present on Admission: **None**  Principal Problem:   Withdrawal seizures (HCC)  Alcohol withdrawal seizures, POA Continue CIWA protocol initiated in ED IV Ativan as needed for breakthrough seizures Seizure precautions  SIRS  Fevers Tmax 100.5.  Heart rate 103s. Monitor fever curve and WBC No clear source of infection, chest x-ray nonacute, UA no pyuria.  Alcohol abuse with concern for withdrawal Inpatient detoxification CIWA protocol TOC consulted to assist with providing resources for alcohol cessation Consider p.o. Valium when able to take oral intake  Prolonged QTc QTc from admission twelve-lead EKG 536 Avoid QTc prolonging agents Optimize magnesium and potassium levels  Hypokalemia Hypomagnesemia Repleted intravenously Goal magnesium greater than 2.0 Goal potassium greater than 4.0.  Polyneuropathy Resume home medications once able to tolerate oral intake.  Chronic anxiety/depression Resume home  Zoloft when able to tolerate p.o.   DVT prophylaxis: Subcu enoxaparin daily  Code Status: Full code  Family Communication: Updated his wife at bedside  Disposition Plan: Admitted to progressive care unit  Consults called: None.  Admission status: Inpatient status.  The patient requires at least 2 midnights for evaluation and treatment of present condition.   Status is: Inpatient    Darlin Drop MD [facility] Pager [phone]  If 7PM-7AM, please contact night-coverage [email or web address] Password [facility]  02/08/2022, 12:33 AM

## 2022-02-09 ENCOUNTER — Encounter (HOSPITAL_COMMUNITY): Payer: Self-pay | Admitting: Internal Medicine

## 2022-02-09 LAB — CBC WITH DIFFERENTIAL/PLATELET
Abs Immature Granulocytes: 0.02 10*3/uL (ref 0.00–0.07)
Basophils Absolute: 0 10*3/uL (ref 0.0–0.1)
Basophils Relative: 0 %
Eosinophils Absolute: 0.1 10*3/uL (ref 0.0–0.5)
Eosinophils Relative: 1 %
HCT: 36.8 % — ABNORMAL LOW (ref 39.0–52.0)
Hemoglobin: 13.1 g/dL (ref 13.0–17.0)
Immature Granulocytes: 0 %
Lymphocytes Relative: 23 %
Lymphs Abs: 1.6 10*3/uL (ref 0.7–4.0)
MCH: 35.8 pg — ABNORMAL HIGH (ref 26.0–34.0)
MCHC: 35.6 g/dL (ref 30.0–36.0)
MCV: 100.5 fL — ABNORMAL HIGH (ref 80.0–100.0)
Monocytes Absolute: 0.7 10*3/uL (ref 0.1–1.0)
Monocytes Relative: 10 %
Neutro Abs: 4.5 10*3/uL (ref 1.7–7.7)
Neutrophils Relative %: 66 %
Platelets: 105 10*3/uL — ABNORMAL LOW (ref 150–400)
RBC: 3.66 MIL/uL — ABNORMAL LOW (ref 4.22–5.81)
RDW: 12.9 % (ref 11.5–15.5)
WBC: 6.9 10*3/uL (ref 4.0–10.5)
nRBC: 0 % (ref 0.0–0.2)

## 2022-02-09 LAB — FOLATE: Folate: 21 ng/mL (ref >5.9)

## 2022-02-09 LAB — PROTIME-INR
INR: 1 (ref 0.8–1.2)
Prothrombin Time: 13.4 seconds (ref 11.4–15.2)

## 2022-02-09 LAB — COMPREHENSIVE METABOLIC PANEL
ALT: 63 U/L — ABNORMAL HIGH (ref 0–44)
AST: 99 U/L — ABNORMAL HIGH (ref 15–41)
Albumin: 3 g/dL — ABNORMAL LOW (ref 3.5–5.0)
Alkaline Phosphatase: 85 U/L (ref 38–126)
Anion gap: 10 (ref 5–15)
BUN: 18 mg/dL (ref 6–20)
CO2: 26 mmol/L (ref 22–32)
Calcium: 8.8 mg/dL — ABNORMAL LOW (ref 8.9–10.3)
Chloride: 99 mmol/L (ref 98–111)
Creatinine, Ser: 0.96 mg/dL (ref 0.61–1.24)
GFR, Estimated: >60 mL/min (ref >60)
Glucose, Bld: 94 mg/dL (ref 70–99)
Potassium: 3.3 mmol/L — ABNORMAL LOW (ref 3.5–5.1)
Sodium: 135 mmol/L (ref 135–145)
Total Bilirubin: 1.3 mg/dL — ABNORMAL HIGH (ref 0.3–1.2)
Total Protein: 5.8 g/dL — ABNORMAL LOW (ref 6.5–8.1)

## 2022-02-09 LAB — PHOSPHORUS: Phosphorus: 3.3 mg/dL (ref 2.5–4.6)

## 2022-02-09 LAB — MAGNESIUM: Magnesium: 1.6 mg/dL — ABNORMAL LOW (ref 1.7–2.4)

## 2022-02-09 LAB — VITAMIN B12: Vitamin B-12: 620 pg/mL (ref 180–914)

## 2022-02-09 MED ORDER — POTASSIUM CHLORIDE CRYS ER 20 MEQ PO TBCR
40.0000 meq | EXTENDED_RELEASE_TABLET | ORAL | Status: AC
  Administered 2022-02-09 (×2): 40 meq via ORAL
  Filled 2022-02-09 (×2): qty 2

## 2022-02-09 MED ORDER — MAGNESIUM SULFATE 4 GM/100ML IV SOLN
4.0000 g | Freq: Once | INTRAVENOUS | Status: AC
  Administered 2022-02-09: 4 g via INTRAVENOUS
  Filled 2022-02-09: qty 100

## 2022-02-09 NOTE — Evaluation (Signed)
Physical Therapy Evaluation Patient Details Name: Matthew Shepard MRN: [id number] DOB: 1965/09/14 Today's Date: 02/09/2022  History of Present Illness  Patient is 56 y.o. male admitted with alcohol withdrawal seizure. PMH significant for alcohol abuse, chronic anxiety/depression, GERD, polyneuropathy, A-fib.   Clinical Impression  Matthew Shepard is 56 y.o. male admitted with above HPI and diagnosis. Patient is currently limited by functional impairments below (see PT problem list). Patient lives with his wife and they recently have been staying with her aunt. At baseline he is independent with no device for mobility. Currently he requires guarding/supervision for transfers and min assist for gait due to mild unsteadiness. He was able to ambulate ~200' with HHA and cues for use of IV pole to steady balance. Patient will benefit from continued skilled PT interventions to address impairments and progress independence with mobility, recommending OPPT for balance training. Acute PT will follow and progress as able.        Recommendations for follow up therapy are one component of a multi-disciplinary discharge planning process, led by the attending physician.  Recommendations may be updated based on patient status, additional functional criteria and insurance authorization.  Follow Up Recommendations Outpatient PT      Assistance Recommended at Discharge Intermittent Supervision/Assistance  Patient can return home with the following  A little help with walking and/or transfers;A little help with bathing/dressing/bathroom;Assistance with cooking/housework;Direct supervision/assist for medications management;Assist for transportation;Help with stairs or ramp for entrance    Equipment Recommendations None recommended by PT  Recommendations for Other Services       Functional Status Assessment Patient has had a recent decline in their functional status and demonstrates the ability to make  significant improvements in function in a reasonable and predictable amount of time.     Precautions / Restrictions Precautions Precautions: Fall Restrictions Weight Bearing Restrictions: No      Mobility  Bed Mobility Overal bed mobility: Needs Assistance Bed Mobility: Supine to Sit     Supine to sit: Supervision, HOB elevated     General bed mobility comments: supervision for safety as pt slightly quick/impulsive. follows cues well.    Transfers Overall transfer level: Needs assistance Equipment used: None Transfers: Sit to/from Stand Sit to Stand: Min guard           General transfer comment: guarding for safety, pt reliant on UE use to complete sit<>stands    Ambulation/Gait Ambulation/Gait assistance: Min assist Gait Distance (Feet): 200 Feet Assistive device: 1 person hand held assist, IV Pole Gait Pattern/deviations: Step-through pattern, Decreased stride length, Drifts right/left Gait velocity: decr/fair     General Gait Details: HHA throughout gait as pt unsteady and drifts Lt at times. No major LOB noted and cues for safe hand placement for use of IV to steady self.  Stairs            Wheelchair Mobility    Modified Rankin (Stroke Patients Only)       Balance Overall balance assessment: History of Falls, Needs assistance Sitting-balance support: Feet supported Sitting balance-Leahy Scale: Good     Standing balance support: Single extremity supported, Bilateral upper extremity supported, During functional activity Standing balance-Leahy Scale: Fair                   Standardized Balance Assessment Standardized Balance Assessment : Berg Balance Test Berg Balance Test Sit to Stand: Able to stand  independently using hands Standing Unsupported: Able to stand 2 minutes with supervision Sitting with Back Unsupported but  Feet Supported on Floor or Stool: Able to sit safely and securely 2 minutes Stand to Sit: Controls descent by  using hands Transfers: Able to transfer safely, definite need of hands Standing Unsupported with Eyes Closed: Able to stand 10 seconds safely Standing Ubsupported with Feet Together: Able to place feet together independently but unable to hold for 30 seconds From Standing, Reach Forward with Outstretched Arm: Can reach forward >5 cm safely (2") From Standing Position, Pick up Object from Floor: Able to pick up shoe, needs supervision From Standing Position, Turn to Look Behind Over each Shoulder: Looks behind one side only/other side shows less weight shift Turn 360 Degrees: Able to turn 360 degrees safely but slowly Standing Unsupported, Alternately Place Feet on Step/Stool: Able to complete >2 steps/needs minimal assist Standing Unsupported, One Foot in Front: Needs help to step but can hold 15 seconds Standing on One Leg: Tries to lift leg/unable to hold 3 seconds but remains standing independently Total Score: 35         Pertinent Vitals/Pain Pain Assessment Pain Assessment: No/denies pain    Home Living Family/patient expects to be discharged to:: Private residence Living Arrangements: Spouse/significant other Available Help at Discharge: Family (staying with wife's aunt, hoping to get a camper) Type of Home: House (staying at wife's aunt's house, was staying in hotels/motels prior to that)             Additional Comments: Pt reports he and his wife have been staying with her aunt recently. prior to that they relied on housing in motels/hotels. he is hopeful to purchase a camper for them to reside in.    Prior Function Prior Level of Function : Independent/Modified Independent                     Hand Dominance        Extremity/Trunk Assessment   Upper Extremity Assessment Upper Extremity Assessment: Overall WFL for tasks assessed    Lower Extremity Assessment Lower Extremity Assessment: Overall WFL for tasks assessed    Cervical / Trunk  Assessment Cervical / Trunk Assessment: Normal  Communication   Communication: No difficulties  Cognition Arousal/Alertness: Awake/alert Behavior During Therapy: WFL for tasks assessed/performed Overall Cognitive Status: Within Functional Limits for tasks assessed                                          General Comments      Exercises     Assessment/Plan    PT Assessment Patient needs continued PT services  PT Problem List Decreased range of motion;Decreased strength;Decreased balance;Decreased mobility;Decreased activity tolerance;Decreased knowledge of use of DME;Decreased safety awareness       PT Treatment Interventions DME instruction;Gait training;Stair training;Functional mobility training;Therapeutic activities;Therapeutic exercise;Balance training;Patient/family education    PT Goals (Current goals can be found in the Care Plan section)  Acute Rehab PT Goals Patient Stated Goal: get home and back to normal PT Goal Formulation: With patient Time For Goal Achievement: 02/23/22 Potential to Achieve Goals: Good    Frequency Min 3X/week     Co-evaluation               AM-PAC PT "6 Clicks" Mobility  Outcome Measure Help needed turning from your back to your side while in a flat bed without using bedrails?: A Little Help needed moving from lying on your back to sitting on  the side of a flat bed without using bedrails?: A Little Help needed moving to and from a bed to a chair (including a wheelchair)?: A Little Help needed standing up from a chair using your arms (e.g., wheelchair or bedside chair)?: A Little Help needed to walk in hospital room?: A Little Help needed climbing 3-5 steps with a railing? : A Lot 6 Click Score: 17    End of Session Equipment Utilized During Treatment: Gait belt Activity Tolerance: Patient tolerated treatment well Patient left: in bed;with call bell/phone within reach;with bed alarm set Nurse Communication:  Mobility status PT Visit Diagnosis: Unsteadiness on feet (R26.81);Muscle weakness (generalized) (M62.81);Difficulty in walking, not elsewhere classified (R26.2)    Time: 1020-1049 PT Time Calculation (min) (ACUTE ONLY): 29 min   Charges:   PT Evaluation $PT Eval Moderate Complexity: 1 Mod PT Treatments $Gait Training: 8-22 mins        Wynn Maudlin, DPT Acute Rehabilitation Services Office [phone]  02/09/22 12:29 PM

## 2022-02-09 NOTE — Progress Notes (Signed)
PROGRESS NOTE    Matthew Shepard  [id number] DOB: Feb 20, 1966 DOA: 02/07/2022 PCP: Patient, No Pcp Per  Chief Complaint  Patient presents with   Seizures    Brief Narrative:  Matthew Shepard is Matthew Shepard 56 y.o. male with medical history significant for alcohol abuse, chronic anxiety/depression, GERD, polyneuropathy, who presented to [facility] ED from home due to witnessed seizures.  The patient drinks alcohol heavily 1 pint/day.  He tried to cut down on drinking for the past few days.  He had Amberly Livas dentist appointment and was told he needed to cut down on drinking, was prescribed amoxicillin 3 days ago by his dentist.  Has been having intermittent nausea and vomiting.  Has been unable to keep anything down for the past week.  Last alcohol intake was yesterday morning but vomited     This morning, while on the couch he was noted to have tonic-clonic seizure.  EMS was activated.  Upon EMS arrival he was postictal.  He was brought into the ED for further evaluation.   In the ED, postictal and lethargic.  Head CT nonacute.  He was started on multivitamins, IV thiamine and folic acid and CIWA protocol.  He also had some electrolyte derangement which are being repleted.  TRH, hospitalist service, was asked to admit.   ED Course: Tmax 100.5.  BP 105/76, pulse 98, respiration rate 16, O2 saturation 93% on room air.  Lab studies remarkable for serum sodium 132, potassium 2.6, glucose 118, AST 124, ALT 66, platelet 145.   Assessment & Plan:   Principal Problem:   Withdrawal seizures (HCC)  Complicated Etoh Withdrawal  Alcohol Withdrawal Seizures Phenobarb taper Ativan per ciwa protocol Follow CIWA EEG within normal limits, MRI without acute findings as below High dose thiamine.  Folate, MVI. Consider discussion of meds prior to discharge Seizure precautions  Per [facility] statutes, patients with seizures are not allowed to drive until  they have been seizure-free for six months. Use caution when  using heavy equipment or power tools. Avoid working on ladders or at heights. Take showers instead of baths. Ensure the water temperature is not too high on the home water heater. Do not go swimming alone. When caring for infants or small children, sit down when holding, feeding, or changing them to minimize risk of injury to the child in the event you have Matthew Shepard seizure. Also, Maintain good sleep hygiene. Avoid alcohol.   Gait Abnormality He's unable to describe this well due to sedation, notes issues with gait over the past few days Symmetric strength to lower extremities on exam Follow MRI -> no acute intracranial pathology Possibly related to chronic etoh use, wernickes? High dose thiamine as above. PT/OT -> outpatient PT  SIRS Fever, tachycardia, tachypnea  CXR without acute cardiopulm dz UA not concerning for UTI Follow, consider blood cultures if recurrent fevers  Elevated LFT's Related to etoh use INR wnl  Thrombocytopenia Related to etoh use, trend  Prolonged Qtc  Left Bundle Block Replace K and mag Follow repeat  Hypokalemia Hypomagnesemia Replace and follow  Polyneuropathy Not taking gabapentin per med rec  Chronic Anxiety and Depression Per med rec, not taking zoloft    DVT prophylaxis: lovenox Code Status: full Family Communication: daughter, family at bedside Disposition:   Status is: Inpatient Remains inpatient appropriate because: complicated withdrawal   Consultants:  none  Procedures:  none  Antimicrobials:  Anti-infectives (From admission, onward)    None       Subjective: No  complaints today  Objective: Vitals:   02/09/22 0320 02/09/22 0732 02/09/22 1119 02/09/22 1532  BP: 99/71 111/65 107/76 107/72  Pulse: 83 86 81 81  Resp: 16 16 16 16   Temp: 98.9 F (37.2 C) 98.4 F (36.9 C) 98.7 F (37.1 C) 98.3 F (36.8 C)  TempSrc: Oral Oral Oral Oral  SpO2: 96% 99% 95% 99%  Weight:      Height:        Intake/Output Summary (Last  24 hours) at 02/09/2022 1925 Last data filed at 02/09/2022 1200 Gross per 24 hour  Intake 1007.06 ml  Output --  Net 1007.06 ml   Filed Weights   02/08/22 2230  Weight: 62.9 kg    Examination:  General: No acute distress. Cardiovascular: RRR Lungs: unlabored Abdomen: Soft, nontender, nondistended  Neurological: Alert and oriented 3. Moves all extremities 4 with equal strength. Cranial nerves II through XII grossly intact. Extremities: No clubbing or cyanosis. No edema.    Data Reviewed: I have personally reviewed following labs and imaging studies  CBC: Recent Labs  Lab 02/07/22 2031 02/08/22 0250 02/09/22 0253  WBC 9.4 6.5 6.9  NEUTROABS 7.2  --  4.5  HGB 13.5 12.3* 13.1  HCT 37.6* 34.9* 36.8*  MCV 99.2 100.9* 100.5*  PLT 145* 104* 105*    Basic Metabolic Panel: Recent Labs  Lab 02/07/22 2200 02/08/22 0830 02/09/22 0253  NA 132* 134* 135  K 2.6* 3.0* 3.3*  CL 96* 103 99  CO2 25 24 26   GLUCOSE 118* 90 94  BUN 10 9 18   CREATININE 1.03 0.83 0.96  CALCIUM 8.2* 8.1* 8.8*  MG 1.2* 2.1 1.6*  PHOS  --   --  3.3    GFR: Estimated Creatinine Clearance: 76.4 mL/min (by C-G formula based on SCr of 0.96 mg/dL).  Liver Function Tests: Recent Labs  Lab 02/07/22 2200 02/09/22 0253  AST 124* 99*  ALT 66* 63*  ALKPHOS 84 85  BILITOT 1.1 1.3*  PROT 5.7* 5.8*  ALBUMIN 3.0* 3.0*    CBG: No results for input(s): "GLUCAP" in the last 168 hours.   Recent Results (from the past 240 hour(s))  Resp Panel by RT-PCR (Flu Curran Lenderman&B, Covid) Anterior Nasal Swab     Status: None   Collection Time: 02/07/22  8:14 PM   Specimen: Anterior Nasal Swab  Result Value Ref Range Status   SARS Coronavirus 2 by RT PCR NEGATIVE NEGATIVE Final    Comment: (NOTE) SARS-CoV-2 target nucleic acids are NOT DETECTED.  The SARS-CoV-2 RNA is generally detectable in upper respiratory specimens during the acute phase of infection. The lowest concentration of SARS-CoV-2 viral copies this  assay can detect is 138 copies/mL. Porscha Axley negative result does not preclude SARS-Cov-2 infection and should not be used as the sole basis for treatment or other patient management decisions. Ballard Budney negative result may occur with  improper specimen collection/handling, submission of specimen other than nasopharyngeal swab, presence of viral mutation(s) within the areas targeted by this assay, and inadequate number of viral copies(<138 copies/mL). Davetta Olliff negative result must be combined with clinical observations, patient history, and epidemiological information. The expected result is Negative.  Fact Sheet for Patients:  EntrepreneurPulse.com.au  Fact Sheet for Healthcare Providers:  IncredibleEmployment.be  This test is no t yet approved or cleared by the [place] FDA and  has been authorized for detection and/or diagnosis of SARS-CoV-2 by FDA under an Emergency Use Authorization (EUA). This EUA will remain  in effect (meaning this test can be  used) for the duration of the COVID-19 declaration under Section 564(b)(1) of the Act, 21 U.S.C.section 360bbb-3(b)(1), unless the authorization is terminated  or revoked sooner.       Influenza Darry Kelnhofer by PCR NEGATIVE NEGATIVE Final   Influenza B by PCR NEGATIVE NEGATIVE Final    Comment: (NOTE) The Xpert Xpress SARS-CoV-2/FLU/RSV plus assay is intended as an aid in the diagnosis of influenza from Nasopharyngeal swab specimens and should not be used as Tamalyn Wadsworth sole basis for treatment. Nasal washings and aspirates are unacceptable for Xpert Xpress SARS-CoV-2/FLU/RSV testing.  Fact Sheet for Patients: BloggerCourse.com  Fact Sheet for Healthcare Providers: SeriousBroker.it  This test is not yet approved or cleared by the [place] FDA and has been authorized for detection and/or diagnosis of SARS-CoV-2 by FDA under an Emergency Use Authorization (EUA). This EUA will  remain in effect (meaning this test can be used) for the duration of the COVID-19 declaration under Section 564(b)(1) of the Act, 21 U.S.C. section 360bbb-3(b)(1), unless the authorization is terminated or revoked.  Performed at [facility], [address], [place], [place] [address]          Radiology Studies: EEG adult  Result Date: 02/11/22 [name], MD     Feb 11, 2022  4:04 PM Patient Name: Matthew Shepard MRN: [id number] Epilepsy Attending: [name] Referring Physician/Provider: Zigmund Daniel., MD Date: Feb 11, 2022 Duration: 21.08 mins Patient history: 56yo M with alcohol withdrawal seizure. EEG to evaluate for seizure. Level of alertness:  lethargic AEDs during EEG study: Ativan, Phenobarb Technical aspects: This EEG study was done with scalp electrodes positioned according to the 10-20 International system of electrode placement. Electrical activity was reviewed with band pass filter of 1-70Hz , sensitivity of 7 uV/mm, display speed of 3mm/sec with Unika Nazareno 60Hz  notched filter applied as appropriate. EEG data were recorded continuously and digitally stored.  Video monitoring was available and reviewed as appropriate. Description: EEG showed an excessive amount of 15 to 18 Hz beta activity distributed symmetrically and diffusely. Hyperventilation and photic stimulation were not performed.   ABNORMALITY - Excessive beta, generalized IMPRESSION: This study is within normal limits. The excessive beta activity seen in the background is most likely due to the effect of benzodiazepine and is Erique Kaser benign EEG pattern. No seizures or epileptiform discharges were seen throughout the recording.   MR BRAIN WO CONTRAST  Result Date: 02-11-22 CLINICAL DATA:  New onset seizure. EXAM: MRI HEAD WITHOUT CONTRAST TECHNIQUE: Multiplanar, multiecho pulse sequences of the brain and surrounding structures were obtained without intravenous contrast. COMPARISON:  CT head 1  day prior FINDINGS: Brain: There is no acute intracranial hemorrhage, extra-axial fluid collection, or acute infarct. There is mild parenchymal volume loss with prominence of the ventricular system and extra-axial CSF spaces, slightly advanced for age. Gray-white differentiation is preserved. There are Savalas Monje few small foci of of nonspecific FLAIR signal abnormality in the supratentorial white matter and mild periventricular FLAIR signal abnormality which may reflect sequela of mild/early chronic small-vessel ischemic change. There is no suspicious parenchymal signal abnormality. There is no mass lesion. There is no mass effect or midline shift. The hippocampi are normal in signal and architecture. Vascular: Normal flow voids. Skull and upper cervical spine: Normal marrow signal. Sinuses/Orbits: There is moderate mucosal thickening in the right maxillary sinus. The globes and orbits are unremarkable. Other: None. IMPRESSION: No acute intracranial pathology or epileptogenic focus identified. Electronically Signed   By: 14/03/2021.D.  On: 02/08/2022 10:29   CT Head Wo Contrast  Result Date: 02/07/2022 CLINICAL DATA:  Seizure, new-onset, no history of trauma EXAM: CT HEAD WITHOUT CONTRAST TECHNIQUE: Contiguous axial images were obtained from the base of the skull through the vertex without intravenous contrast. RADIATION DOSE REDUCTION: This exam was performed according to the departmental dose-optimization program which includes automated exposure control, adjustment of the mA and/or kV according to patient size and/or use of iterative reconstruction technique. COMPARISON:  CT head 09/10/2018 FINDINGS: Brain: No evidence of large-territorial acute infarction. No parenchymal hemorrhage. No mass lesion. No extra-axial collection. No mass effect or midline shift. No hydrocephalus. Basilar cisterns are patent. Vascular: No hyperdense vessel. Skull: No acute fracture or focal lesion. Sinuses/Orbits: Almost complete  opacification of the right maxillary sinus. Associated mild hyper trophy of the sinus wall consistent with chronic sinus disease. Paranasal sinuses and mastoid air cells are clear. The orbits are unremarkable. Other: None. IMPRESSION: No acute intracranial abnormality. Electronically Signed   By: Iven Finn M.D.   On: 02/07/2022 23:01   DG Chest Port 1 View  Result Date: 02/07/2022 CLINICAL DATA:  Seizure-like activity. EXAM: PORTABLE CHEST 1 VIEW COMPARISON:  November 03, 2020 FINDINGS: The heart size and mediastinal contours are within normal limits. There is no evidence of an acute infiltrate, pleural effusion or pneumothorax. The visualized skeletal structures are unremarkable. IMPRESSION: No active cardiopulmonary disease. Electronically Signed   By: Virgina Norfolk M.D.   On: 02/07/2022 21:04        Scheduled Meds:  enoxaparin (LOVENOX) injection  40 mg Subcutaneous Daily   folic acid  1 mg Oral Daily   LORazepam  0-4 mg Intravenous Q6H   Followed by   Derrill Memo ON 02/10/2022] LORazepam  0-4 mg Intravenous Q12H   multivitamin with minerals  1 tablet Oral Daily   nicotine  21 mg Transdermal Daily   phenobarbital  97.2 mg Oral Q8H   Followed by   Derrill Memo ON 02/10/2022] phenobarbital  64.8 mg Oral Q8H   Followed by   Derrill Memo ON 02/12/2022] phenobarbital  32.4 mg Oral Q8H   [START ON 02/16/2022] thiamine  100 mg Oral Daily   Continuous Infusions:  thiamine (VITAMIN B1) injection 500 mg (02/09/22 1234)   Followed by   Derrill Memo ON 02/11/2022] thiamine (VITAMIN B1) injection       LOS: 1 day    Time spent: over 30 min    Fayrene Helper, MD [facility]   To contact the attending provider between 7A-7P or the covering provider during after hours 7P-7A, please log into the web site [email or web address] and access using universal [place] password for that web site. If you do not have the password, please call the hospital operator.  02/09/2022, 7:25 PM

## 2022-02-09 NOTE — Plan of Care (Signed)
  Problem: Education: Goal: Knowledge of General Education information will improve Description Including pain rating scale, medication(s)/side effects and non-pharmacologic comfort measures Outcome: Progressing   Problem: Health Behavior/Discharge Planning: Goal: Ability to manage health-related needs will improve Outcome: Progressing   

## 2022-02-09 NOTE — Progress Notes (Signed)
Cardiac Monitor Tech Kendal Hymen called and stated when pt was adm Tele was showing ST elevation in V and MCL leads ( V= 2.6 and MCL=2.9) pt was alert and asymptomatic (this was 2240 02/08/22). Per Kendal Hymen EKG in ED showed Acute MI and prolonged QT. Per MD notes EKG was SR with LBBB and one note also stated Prolonged QT.  Per wife and pt he has a hx of Afib but not found in any doctor hx notes. Will notify Dr. Arlean Hopping and inform him of above.

## 2022-02-09 NOTE — Progress Notes (Signed)
Dr.Howerter confirmed per old record from [facility] pt has hx of A Fib and he's going to add it to pt's Hx. Dr. Arlean Hopping stated he reviewed EKG from ED and stated he didn't see any indication of Acute MI and no ST elevation on Rhythm Strips from Adm, but does have prolonged QT.

## 2022-02-09 NOTE — Progress Notes (Signed)
Pt arrived per w/c from ED around 2215 but immediately got onto the [facility] to have a BM.

## 2022-02-09 NOTE — Evaluation (Signed)
Occupational Therapy Evaluation Patient Details Name: Matthew Shepard MRN: [id number] DOB: 06-20-1965 Today's Date: 02/09/2022   History of Present Illness Patient is 56 y.o. male admitted with alcohol withdrawal seizure. PMH significant for alcohol abuse, chronic anxiety/depression, GERD, polyneuropathy, A-fib.   Clinical Impression   Patient is currently requiring assistance with ADLs including min guard assist with Lower body ADLs, setup assist with seated Upper body ADLs,  as well as  min guard assist with functional transfers to toilet.  Current level of function is below patient's typical baseline.  During this evaluation, patient was limited by impaired dynamic standing balance, impaired activity tolerance, and chronic lower back pain, all of which has the potential to impact patient's safety and independence during functional mobility, as well as performance for ADLs.  Patient lives in his wife's aunt's home, with wife and her aunt who are able to provide near 24/7 supervision and assistance.  Patient demonstrates good rehab potential, and should benefit from continued skilled occupational therapy services while in acute care to maximize safety, independence and quality of life at home.       Recommendations for follow up therapy are one component of a multi-disciplinary discharge planning process, led by the attending physician.  Recommendations may be updated based on patient status, additional functional criteria and insurance authorization.   Follow Up Recommendations  No OT follow up     Assistance Recommended at Discharge Intermittent Supervision/Assistance  Patient can return home with the following A little help with bathing/dressing/bathroom;Direct supervision/assist for medications management;Direct supervision/assist for financial management;Assistance with cooking/housework    Functional Status Assessment  Patient has had a recent decline in their functional status and  demonstrates the ability to make significant improvements in function in a reasonable and predictable amount of time.  Equipment Recommendations  None recommended by OT (Pending progress)    Recommendations for Other Services       Precautions / Restrictions Precautions Precautions: Fall Restrictions Weight Bearing Restrictions: No      Mobility Bed Mobility Overal bed mobility: Needs Assistance Bed Mobility: Supine to Sit     Supine to sit: Supervision, HOB elevated     General bed mobility comments: supervision for safety as pt slightly quick/impulsive. follows cues well.    Transfers                          Balance Overall balance assessment: History of Falls, Needs assistance Sitting-balance support: Feet supported Sitting balance-Leahy Scale: Good     Standing balance support: Single extremity supported, During functional activity Standing balance-Leahy Scale: Fair                             ADL either performed or assessed with clinical judgement   ADL Overall ADL's : Needs assistance/impaired Eating/Feeding: Independent;Bed level   Grooming: Standing;Min guard;Wash/dry hands   Upper Body Bathing: Set up;Sitting   Lower Body Bathing: Min guard;Sit to/from stand;Sitting/lateral leans   Upper Body Dressing : Set up;Sitting   Lower Body Dressing: Min guard Lower Body Dressing Details (indicate cue type and reason): Setup at EOB with socks. Min guard for standing. Toilet Transfer: Radiographer, therapeutic Details (indicate cue type and reason): Good eccentric control lowered to toilet. Toileting- Clothing Manipulation and Hygiene: Supervision/safety   Tub/ Shower Transfer: Insurance risk surveyor Details (indicate cue type and reason): Held wall for support and simulated high steps over lip of  shower Functional mobility during ADLs: Supervision/safety;Min guard General ADL Comments: Pt asked to try in-room  ambulationwithout HHA or IV pole and able with one scissoring stuble requiring Min gaurd assist.     Vision   Vision Assessment?: No apparent visual deficits     Perception     Praxis      Pertinent Vitals/Pain Pain Assessment Pain Assessment: 0-10 Pain Score: 5  Pain Location: back pain, chronic Pain Intervention(s): Limited activity within patient's tolerance, Monitored during session, Repositioned     Hand Dominance Left (Ambidextrous)   Extremity/Trunk Assessment Upper Extremity Assessment Upper Extremity Assessment: Overall WFL for tasks assessed   Lower Extremity Assessment Lower Extremity Assessment: Overall WFL for tasks assessed   Cervical / Trunk Assessment Cervical / Trunk Assessment: Normal;Other exceptions Cervical / Trunk Exceptions: stooped   Communication Communication Communication: No difficulties   Cognition Arousal/Alertness: Awake/alert Behavior During Therapy: WFL for tasks assessed/performed Overall Cognitive Status: Within Functional Limits for tasks assessed                                 General Comments: Ox4 with one cue for year as initially stated "1923".     General Comments       Exercises     Shoulder Instructions      Home Living Family/patient expects to be discharged to:: Private residence Living Arrangements: Spouse/significant other Available Help at Discharge: Family;Other (Comment);Available PRN/intermittently (Staying with wife's Aunt. Hoping to move into a camper soon.) Type of Home: House Home Access: Stairs to enter [employer] of Steps: 5 Entrance Stairs-Rails: Right;Left (unsure if can reach both) Home Layout: One level     Bathroom Shower/Tub: Chief Strategy Officer: Handicapped height         Additional Comments: Pt reports he and his wife have been staying with her aunt recently. prior to that they relied on housing in motels/hotels. he is hopeful to purchase a  camper for them to reside in.  Pt stated that there MAY be a shower chair at Aunt's home but unsure.      Prior Functioning/Environment Prior Level of Function : Independent/Modified Independent;Driving;Other (comment) (Unemployed and interested in disability)                        OT Problem List: Pain;Decreased safety awareness;Decreased activity tolerance;Impaired balance (sitting and/or standing)      OT Treatment/Interventions: Self-care/ADL training;Therapeutic exercise;Therapeutic activities;Cognitive remediation/compensation;DME and/or AE instruction;Patient/family education;Balance training    OT Goals(Current goals can be found in the care plan section) Acute Rehab OT Goals Patient Stated Goal: To get well enough to move into a camper with spouse. OT Goal Formulation: With patient Time For Goal Achievement: 02/23/22 Potential to Achieve Goals: Good ADL Goals Pt Will Perform Lower Body Bathing: with modified independence;sit to/from stand Pt Will Perform Toileting - Clothing Manipulation and hygiene: with modified independence;sit to/from stand;sitting/lateral leans Pt Will Perform Tub/Shower Transfer: with modified independence;Tub transfer;ambulating Additional ADL Goal #1: Pt will engage in 10 min functional standing activities without loss of standing balance and withoutUE support, in order to demonstrate improved activity tolerance and balance needed to perform ADLs safely at home. Additional ADL Goal #2: Pt will perform one IADL such as stripping and making his bed with supervision, no LOB while demonstrating intact safety awareness.  OT Frequency: Min 2X/week    Co-evaluation  AM-PAC OT "6 Clicks" Daily Activity     Outcome Measure Help from another person eating meals?: None Help from another person taking care of personal grooming?: A Little Help from another person toileting, which includes using toliet, bedpan, or urinal?: A Little Help  from another person bathing (including washing, rinsing, drying)?: A Little Help from another person to put on and taking off regular upper body clothing?: A Little Help from another person to put on and taking off regular lower body clothing?: A Little 6 Click Score: 19   End of Session Equipment Utilized During Treatment: Gait belt  Activity Tolerance: Patient tolerated treatment well Patient left: in bed;with call bell/phone within reach;with bed alarm set  OT Visit Diagnosis: Unsteadiness on feet (R26.81);History of falling (Z91.81);Pain Pain - part of body:  (lower back)                Time: 7564-3329 OT Time Calculation (min): 23 min Charges:  OT General Charges $OT Visit: 1 Visit OT Evaluation $OT Eval Low Complexity: 1 Low OT Treatments $Self Care/Home Management : 8-22 mins  Victorino Dike, OT Acute Rehab Services Office: [phone] 02/09/2022  Theodoro Clock 02/09/2022, 1:42 PM

## 2022-02-10 LAB — CBC
HCT: 34.6 % — ABNORMAL LOW (ref 39.0–52.0)
Hemoglobin: 12.2 g/dL — ABNORMAL LOW (ref 13.0–17.0)
MCH: 35.9 pg — ABNORMAL HIGH (ref 26.0–34.0)
MCHC: 35.3 g/dL (ref 30.0–36.0)
MCV: 101.8 fL — ABNORMAL HIGH (ref 80.0–100.0)
Platelets: 106 10*3/uL — ABNORMAL LOW (ref 150–400)
RBC: 3.4 MIL/uL — ABNORMAL LOW (ref 4.22–5.81)
RDW: 13 % (ref 11.5–15.5)
WBC: 4.9 10*3/uL (ref 4.0–10.5)
nRBC: 0 % (ref 0.0–0.2)

## 2022-02-10 LAB — COMPREHENSIVE METABOLIC PANEL WITH GFR
ALT: 57 U/L — ABNORMAL HIGH (ref 0–44)
AST: 78 U/L — ABNORMAL HIGH (ref 15–41)
Albumin: 2.7 g/dL — ABNORMAL LOW (ref 3.5–5.0)
Alkaline Phosphatase: 93 U/L (ref 38–126)
Anion gap: 6 (ref 5–15)
BUN: 10 mg/dL (ref 6–20)
CO2: 26 mmol/L (ref 22–32)
Calcium: 8.1 mg/dL — ABNORMAL LOW (ref 8.9–10.3)
Chloride: 104 mmol/L (ref 98–111)
Creatinine, Ser: 0.85 mg/dL (ref 0.61–1.24)
GFR, Estimated: >60 mL/min
Glucose, Bld: 125 mg/dL — ABNORMAL HIGH (ref 70–99)
Potassium: 3.7 mmol/L (ref 3.5–5.1)
Sodium: 136 mmol/L (ref 135–145)
Total Bilirubin: 0.5 mg/dL (ref 0.3–1.2)
Total Protein: 5.4 g/dL — ABNORMAL LOW (ref 6.5–8.1)

## 2022-02-10 LAB — MAGNESIUM: Magnesium: 1.4 mg/dL — ABNORMAL LOW (ref 1.7–2.4)

## 2022-02-10 LAB — PHOSPHORUS: Phosphorus: 2.9 mg/dL (ref 2.5–4.6)

## 2022-02-10 MED ORDER — MAGNESIUM SULFATE 4 GM/100ML IV SOLN
4.0000 g | Freq: Once | INTRAVENOUS | Status: AC
  Administered 2022-02-10: 4 g via INTRAVENOUS
  Filled 2022-02-10: qty 100

## 2022-02-10 MED ORDER — NALTREXONE HCL 50 MG PO TABS
50.0000 mg | ORAL_TABLET | Freq: Every day | ORAL | Status: DC
  Administered 2022-02-10 – 2022-02-11 (×2): 50 mg via ORAL
  Filled 2022-02-10 (×2): qty 1

## 2022-02-10 MED ORDER — ENSURE MAX PROTEIN PO LIQD
11.0000 [oz_av] | Freq: Every day | ORAL | Status: DC
  Administered 2022-02-10: 11 [oz_av] via ORAL
  Filled 2022-02-10 (×2): qty 330

## 2022-02-10 MED ORDER — POTASSIUM CHLORIDE CRYS ER 20 MEQ PO TBCR
40.0000 meq | EXTENDED_RELEASE_TABLET | Freq: Once | ORAL | Status: AC
  Administered 2022-02-10: 40 meq via ORAL
  Filled 2022-02-10: qty 2

## 2022-02-10 NOTE — Progress Notes (Signed)
PROGRESS NOTE    Matthew Shepard  [id number] DOB: 1965/08/09 DOA: 02/07/2022 PCP: Patient, No Pcp Per  Chief Complaint  Patient presents with   Seizures    Brief Narrative:  Matthew Shepard is Matthew Shepard 56 y.o. male with medical history significant for alcohol abuse, chronic anxiety/depression, GERD, polyneuropathy, who presented to [facility] ED from home due to witnessed seizures.  The patient drinks alcohol heavily 1 pint/day.  He tried to cut down on drinking for the past few days.  He had Matthew Shepard dentist appointment and was told he needed to cut down on drinking, was prescribed amoxicillin 3 days ago by his dentist.  Has been having intermittent nausea and vomiting.  Has been unable to keep anything down for the past week.  Last alcohol intake was yesterday morning but vomited     This morning, while on the couch he was noted to have tonic-clonic seizure.  EMS was activated.  Upon EMS arrival he was postictal.  He was brought into the ED for further evaluation.  He was admitted for etoh withdrawal seizures.  He's done well on phenobarbital taper, will stop this today and if he's doing well 12/4 consider discharge.  Assessment & Plan:   Principal Problem:   Withdrawal seizures ([place])  Complicated Etoh Withdrawal  Alcohol Withdrawal Seizures Multiple (2?) seizures at home before he presented Started on phenobarbital on admission due to risk of complicated withdrawal with presentation with etoh withdrawal seizures Doing well, will stop phenobarbital and watch how he does overnight, could consider d/c if doing well Ativan per ciwa protocol Follow CIWA EEG within normal limits, MRI without acute findings as below High dose thiamine.  Folate, MVI. Consider discussion of meds prior to discharge -> he's open to naltrexone, will start this here (discussed with pharmacy). Seizure precautions  Per [facility] statutes, patients with seizures are not allowed to drive until  they have been  seizure-free for six months. Use caution when using heavy equipment or power tools. Avoid working on ladders or at heights. Take showers instead of baths. Ensure the water temperature is not too high on the home water heater. Do not go swimming alone. When caring for infants or small children, sit down when holding, feeding, or changing them to minimize risk of injury to the child in the event you have Matthew Shepard seizure. Also, Maintain good sleep hygiene. Avoid alcohol.   Gait Abnormality He's unable to describe this well due to sedation, notes issues with gait over the past few days Symmetric strength to lower extremities on exam Follow MRI -> no acute intracranial pathology Possibly related to chronic etoh use, wernickes? High dose thiamine as above. Seems to be improved? follow PT/OT -> outpatient PT  SIRS Fever, tachycardia, tachypnea  CXR without acute cardiopulm dz UA not concerning for UTI Follow, consider blood cultures if recurrent fevers  Elevated LFT's Related to etoh use improving INR wnl  Thrombocytopenia Related to etoh use, trend  Prolonged Qtc  Left Bundle Block Replace K and mag Avoid QT prolonging meds  Hypokalemia Hypomagnesemia Replace and follow With prolonged Qtc, goal K>4, mag>2  Polyneuropathy Not taking gabapentin per med rec  Chronic Anxiety and Depression Per med rec, not taking zoloft    DVT prophylaxis: lovenox Code Status: full Family Communication: daughter, family at bedside Disposition:   Status is: Inpatient Remains inpatient appropriate because: complicated withdrawal   Consultants:  none  Procedures:  none  Antimicrobials:  Anti-infectives (From admission, onward)  None       Subjective: Denies complaints  Objective: Vitals:   02/10/22 0456 02/10/22 0821 02/10/22 1141 02/10/22 1612  BP: 109/71 121/77 109/73 118/88  Pulse: 80 86 90 99  Resp:  18 18 18   Temp: 98.9 F (37.2 C) 98.7 F (37.1 C) 99 F (37.2 C) 98.4  F (36.9 C)  TempSrc: Oral Oral Oral Oral  SpO2: 96% 97% 98% 97%  Weight:      Height:       No intake or output data in the 24 hours ending 02/10/22 2007  Filed Weights   02/08/22 2230  Weight: 62.9 kg    Examination:  General: No acute distress. Cardiovascular: RRR Lungs: unlabored Abdomen: Soft, nontender, nondistended  Neurological: Alert and oriented 3. Moves all extremities 4 with equal strength. Cranial nerves II through XII grossly intact. Extremities: No clubbing or cyanosis. No edema.  Data Reviewed: I have personally reviewed following labs and imaging studies  CBC: Recent Labs  Lab 02/07/22 2031 02/08/22 0250 02/09/22 0253 02/10/22 0230  WBC 9.4 6.5 6.9 4.9  NEUTROABS 7.2  --  4.5  --   HGB 13.5 12.3* 13.1 12.2*  HCT 37.6* 34.9* 36.8* 34.6*  MCV 99.2 100.9* 100.5* 101.8*  PLT 145* 104* 105* 106*    Basic Metabolic Panel: Recent Labs  Lab 02/07/22 2200 02/08/22 0830 02/09/22 0253 02/10/22 0230  NA 132* 134* 135 136  K 2.6* 3.0* 3.3* 3.7  CL 96* 103 99 104  CO2 25 24 26 26   GLUCOSE 118* 90 94 125*  BUN 10 9 18 10   CREATININE 1.03 0.83 0.96 0.85  CALCIUM 8.2* 8.1* 8.8* 8.1*  MG 1.2* 2.1 1.6* 1.4*  PHOS  --   --  3.3 2.9    GFR: Estimated Creatinine Clearance: 86.3 mL/min (by C-G formula based on SCr of 0.85 mg/dL).  Liver Function Tests: Recent Labs  Lab 02/07/22 2200 02/09/22 0253 02/10/22 0230  AST 124* 99* 78*  ALT 66* 63* 57*  ALKPHOS 84 85 93  BILITOT 1.1 1.3* 0.5  PROT 5.7* 5.8* 5.4*  ALBUMIN 3.0* 3.0* 2.7*    CBG: No results for input(s): "GLUCAP" in the last 168 hours.   Recent Results (from the past 240 hour(s))  Resp Panel by RT-PCR (Flu Cesare Sumlin&B, Covid) Anterior Nasal Swab     Status: None   Collection Time: 02/07/22  8:14 PM   Specimen: Anterior Nasal Swab  Result Value Ref Range Status   SARS Coronavirus 2 by RT PCR NEGATIVE NEGATIVE Final    Comment: (NOTE) SARS-CoV-2 target nucleic acids are NOT  DETECTED.  The SARS-CoV-2 RNA is generally detectable in upper respiratory specimens during the acute phase of infection. The lowest concentration of SARS-CoV-2 viral copies this assay can detect is 138 copies/mL. Matthew Shepard negative result does not preclude SARS-Cov-2 infection and should not be used as the sole basis for treatment or other patient management decisions. Kamelia Lampkins negative result may occur with  improper specimen collection/handling, submission of specimen other than nasopharyngeal swab, presence of viral mutation(s) within the areas targeted by this assay, and inadequate number of viral copies(<138 copies/mL). Jennifer Payes negative result must be combined with clinical observations, patient history, and epidemiological information. The expected result is Negative.  Fact Sheet for Patients:  14/02/23  Fact Sheet for Healthcare Providers:  14/03/23  This test is no t yet approved or cleared by the 02/09/22 FDA and  has been authorized for detection and/or diagnosis of SARS-CoV-2 by FDA under  an Emergency Use Authorization (EUA). This EUA will remain  in effect (meaning this test can be used) for the duration of the COVID-19 declaration under Section 564(b)(1) of the Act, 21 U.S.C.section 360bbb-3(b)(1), unless the authorization is terminated  or revoked sooner.       Influenza Rae Sutcliffe by PCR NEGATIVE NEGATIVE Final   Influenza B by PCR NEGATIVE NEGATIVE Final    Comment: (NOTE) The Xpert Xpress SARS-CoV-2/FLU/RSV plus assay is intended as an aid in the diagnosis of influenza from Nasopharyngeal swab specimens and should not be used as Jamaiya Tunnell sole basis for treatment. Nasal washings and aspirates are unacceptable for Xpert Xpress SARS-CoV-2/FLU/RSV testing.  Fact Sheet for Patients: EntrepreneurPulse.com.au  Fact Sheet for Healthcare Providers: IncredibleEmployment.be  This test is not yet  approved or cleared by the [place] FDA and has been authorized for detection and/or diagnosis of SARS-CoV-2 by FDA under an Emergency Use Authorization (EUA). This EUA will remain in effect (meaning this test can be used) for the duration of the COVID-19 declaration under Section 564(b)(1) of the Act, 21 U.S.C. section 360bbb-3(b)(1), unless the authorization is terminated or revoked.  Performed at [facility], [place] [address] [place], [place] [address]          Radiology Studies: No results found.      Scheduled Meds:  enoxaparin (LOVENOX) injection  40 mg Subcutaneous Daily   folic acid  1 mg Oral Daily   LORazepam  0-4 mg Intravenous Q12H   multivitamin with minerals  1 tablet Oral Daily   naltrexone  50 mg Oral Daily   nicotine  21 mg Transdermal Daily   Ensure Max Protein  11 oz Oral Daily   [START ON 02/16/2022] thiamine  100 mg Oral Daily   Continuous Infusions:  thiamine (VITAMIN B1) injection 500 mg (02/10/22 1352)   Followed by   Derrill Memo ON 02/11/2022] thiamine (VITAMIN B1) injection       LOS: 2 days    Time spent: over 30 min    Fayrene Helper, MD [facility]   To contact the attending provider between 7A-7P or the covering provider during after hours 7P-7A, please log into the web site [email or web address] and access using universal  password for that web site. If you do not have the password, please call the hospital operator.  02/10/2022, 8:07 PM

## 2022-02-10 NOTE — Plan of Care (Signed)

## 2022-02-10 NOTE — Progress Notes (Signed)
Initial Nutrition Assessment RD working remotely.   DOCUMENTATION CODES:   Not applicable  INTERVENTION:  - ordered Ensure Max once/day, each supplement provides 150 kcal and 30 grams of protein.  - complete NFPE when feasible.    NUTRITION DIAGNOSIS:   Increased nutrient needs related to acute illness as evidenced by estimated needs.  GOAL:   Patient will meet greater than or equal to 90% of their needs  MONITOR:   PO intake, Labs, Weight trends  REASON FOR ASSESSMENT:   Consult Assessment of nutrition requirement/status  ASSESSMENT:   56 y.o. male with medical history of alcohol abuse (1 pint/day), chronic anxiety, depression, GERD, polyneuropathy, and afib. He presented to the ED from home due to witnessed seizures (tonic-clonic). He had a dentist appointment and was told he needed to cut down on drinking, was prescribed amoxicillin 3 days ago by his dentist. Patient has been experiencing intermittent N/V and inability to keep anything down for the past 1 week. He last drank alcohol on 02/07/22. When EMS arrived, patient was postictal and was brought to the ED for further evaluation. In the ED, CT head was non-acute and he was started on multivitamins, IV thiamine, folic acid, and CIWA protocol.  He also had some electrolyte derangement which are being repleted.  Patient ate 100% at breakfast and lunch on 12/2 (total of 2308 kcal and 56 grams protein). No meal completion percentages documented from dinner last night or breakfast today though both of these meals were ordered and delivered.  He has not been assessed by a [facility] RD at any time in the past.   Weight on 12/1 was 139 lb and PTA the only other documented weights were 179 lb on 04/10/20 and 155 lb on 03/10/14. This indicates 40 lb weight loss (22% body weight) in ~2 years.  No information documented in the edema section of flow sheet.   Labs reviewed; Ca: 8.1 mg/dl, Mg: 1.4 mg/dl, LFTs elevated but trending  down.  Medications reviewed; 1 mg folvite/day, ativan per CIWA protocol, 4 g IV Mg sulfate x1 run 12/3, 1 tablet multivitamin with minerals/day, 40 mEq Klor-Con x2 doses 12/2, thiamine taper per CIWA protocol.    NUTRITION - FOCUSED PHYSICAL EXAM:  RD working remotely.  Diet Order:   Diet Order             Diet regular Room service appropriate? Yes; Fluid consistency: Thin  Diet effective now                   EDUCATION NEEDS:   No education needs have been identified at this time  Skin:  Skin Assessment: Reviewed RN Assessment  Last BM:  PTA/unknown  Height:   Ht Readings from Last 1 Encounters:  02/08/22 5\' 11"  (1.803 m)    Weight:   Wt Readings from Last 1 Encounters:  02/08/22 62.9 kg     BMI:  Body mass index is 19.34 kg/m.  Estimated Nutritional Needs:  Kcal:  2100-2400 kcal Protein:  110-120 grams Fluid:  >/= 2.5 L/day     14/01/23, MS, RD, LDN, CNSC Clinical Dietitian PRN/Relief staff On-call/weekend pager # available in [facility]

## 2022-02-10 NOTE — Plan of Care (Signed)

## 2022-02-10 NOTE — Progress Notes (Signed)
TRH night cross cover note:   I was notified by RN that telemetry was indicating some ST elevation. Patient awake but completely asymptomatic, comfortably watching television.  Vital signs stable.  Stat EKG obtained and demonstrated sinus rhythm with single PVC, heart rate 86, left bundle branch block, and no evidence of T wave or ST changes, while noting the presence of left bundle branch block on most recent prior EKGs from 02/08/2022 as well as 02/07/2022.  Overall, suspect that telemetry was detecting known left bundle branch block in this asymptomatic patient that appears hemodynamically stable.     Matthew Pigg, DO Hospitalist

## 2022-02-10 NOTE — Progress Notes (Signed)
EOS: A&Ox4, tremors present, up ad lib w/ standby assistance - steady gait, began to score CIWA this shift. See PRN's.

## 2022-02-11 DIAGNOSIS — F19932 Other psychoactive substance use, unspecified with withdrawal with perceptual disturbance: Secondary | ICD-10-CM

## 2022-02-11 LAB — BASIC METABOLIC PANEL
Anion gap: 9 (ref 5–15)
BUN: 8 mg/dL (ref 6–20)
CO2: 26 mmol/L (ref 22–32)
Calcium: 9 mg/dL (ref 8.9–10.3)
Chloride: 104 mmol/L (ref 98–111)
Creatinine, Ser: 0.76 mg/dL (ref 0.61–1.24)
GFR, Estimated: >60 mL/min (ref >60)
Glucose, Bld: 102 mg/dL — ABNORMAL HIGH (ref 70–99)
Potassium: 4.7 mmol/L (ref 3.5–5.1)
Sodium: 139 mmol/L (ref 135–145)

## 2022-02-11 LAB — CBC
HCT: 35.3 % — ABNORMAL LOW (ref 39.0–52.0)
Hemoglobin: 12.5 g/dL — ABNORMAL LOW (ref 13.0–17.0)
MCH: 36.3 pg — ABNORMAL HIGH (ref 26.0–34.0)
MCHC: 35.4 g/dL (ref 30.0–36.0)
MCV: 102.6 fL — ABNORMAL HIGH (ref 80.0–100.0)
Platelets: 134 10*3/uL — ABNORMAL LOW (ref 150–400)
RBC: 3.44 MIL/uL — ABNORMAL LOW (ref 4.22–5.81)
RDW: 13.3 % (ref 11.5–15.5)
WBC: 5.2 10*3/uL (ref 4.0–10.5)
nRBC: 0 % (ref 0.0–0.2)

## 2022-02-11 LAB — MAGNESIUM: Magnesium: 1.6 mg/dL — ABNORMAL LOW (ref 1.7–2.4)

## 2022-02-11 LAB — PHOSPHORUS: Phosphorus: 3.4 mg/dL (ref 2.5–4.6)

## 2022-02-11 MED ORDER — FOLIC ACID 1 MG PO TABS: 1.0000 mg | ORAL_TABLET | Freq: Every day | ORAL | 1 refills | Status: AC

## 2022-02-11 MED ORDER — HYDROXYZINE HCL 25 MG PO TABS: 25.0000 mg | ORAL_TABLET | Freq: Three times a day (TID) | ORAL | 0 refills | Status: AC | PRN

## 2022-02-11 MED ORDER — VITAMIN B-1 100 MG PO TABS: 100.0000 mg | ORAL_TABLET | Freq: Every day | ORAL | 1 refills | Status: AC

## 2022-02-11 MED ORDER — ADULT MULTIVITAMIN W/MINERALS CH: 1.0000 | ORAL_TABLET | Freq: Every day | ORAL | 1 refills | Status: AC

## 2022-02-11 NOTE — TOC Transition Note (Signed)
Transition of Care [facility]) - CM/SW Discharge Note   Patient Details  Name: Milledge R [place] MRN: [id number] Date of Birth: 10/08/1965  [facility] of [facility]) CM/SW Contact:  Kermit Balo, RN Phone Number: 02/11/2022, 11:04 AM   Clinical Narrative:    Pt is from home with his spouse. They state he has supervision most of the time.  Wife can provide needed transportation.  Pt is without health insurance or PCP. Pt interested in getting set up with one of the [facility]. CM has placed information on the AVS.  Information on [facility] pharmacy on the AVS.  Outpatient arranged with [facility]. Information on AVS.  Pt has transportation home.    Final next level of care: OP Rehab Barriers to Discharge: Inadequate or no insurance, Barriers Unresolved (comment)   Patient Goals and CMS Choice     Choice offered to / list presented to : Patient, Spouse  Discharge Placement                       Discharge Plan and Services                                     Social Determinants of Health (SDOH) Interventions     Readmission Risk Interventions     No data to display

## 2022-02-11 NOTE — TOC CAGE-AID Note (Signed)
Transition of Care [facility]) - CAGE-AID Screening   Patient Details  Name: Matthew Shepard [place] MRN: [id number] Date of Birth: 03-Aug-1965  Transition of Care [facility]) CM/SW Contact:    Kermit Balo, RN Phone Number: 02/11/2022, 10:43 AM   Clinical Narrative: Pt provided inpatient and outpatient alcohol counseling resources.   CAGE-AID Screening:    Have You Ever Felt You Ought to Cut Down on Your Drinking or Drug Use?: Yes Have People Annoyed You By Critizing Your Drinking Or Drug Use?: No Have You Felt Bad Or Guilty About Your Drinking Or Drug Use?: Yes Have You Ever Had a Drink or Used Drugs First Thing In The Morning to Steady Your Nerves or to Get Rid of a Hangover?: Yes CAGE-AID Score: 3  Substance Abuse Education Offered: Yes  Substance abuse interventions: Patient Counseling, Transport planner
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
## 2022-02-11 NOTE — Progress Notes (Signed)
Physical Therapy Treatment Patient Details Name: Matthew Shepard MRN: [id number] DOB: 04-18-1965 Today's Date: 02/11/2022   History of Present Illness Patient is 56 y.o. male admitted with alcohol withdrawal seizure. PMH significant for alcohol abuse, chronic anxiety/depression, GERD, polyneuropathy, A-fib.    PT Comments    Pt greeted supine in bed, agreeable to session with good progress towards acute goals. Pt demonstrating transfers at supervision level and grossly min guard down to supervision throughout gait with no AD or HHA needed. Pt with no LOB throughout, some noted bobbling with pt able to self correct in all instances. Pt able to ascend/descend full flight in stairwell without fault. Pt scoring 20/24 on DGI indicating pt remains slight risk for falls with pt verbalizing understanding of risk and with fair safety awareness. Will continue to follow acutely.    Recommendations for follow up therapy are one component of a multi-disciplinary discharge planning process, led by the attending physician.  Recommendations may be updated based on patient status, additional functional criteria and insurance authorization.  Follow Up Recommendations  Outpatient PT     Assistance Recommended at Discharge Intermittent Supervision/Assistance  Patient can return home with the following A little help with walking and/or transfers;A little help with bathing/dressing/bathroom;Assistance with cooking/housework;Direct supervision/assist for medications management;Assist for transportation;Help with stairs or ramp for entrance   Equipment Recommendations  None recommended by PT    Recommendations for Other Services       Precautions / Restrictions Precautions Precautions: Fall Restrictions Weight Bearing Restrictions: No     Mobility  Bed Mobility Overal bed mobility: Needs Assistance Bed Mobility: Supine to Sit     Supine to sit: Supervision, HOB elevated     General bed mobility  comments: supervision for safety as pt slightly quick/impulsive. follows cues well.    Transfers Overall transfer level: Needs assistance Equipment used: None Transfers: Sit to/from Stand Sit to Stand: Supervision           General transfer comment: supervision for safety    Ambulation/Gait Ambulation/Gait assistance: Supervision, Min guard Gait Distance (Feet): 400 Feet Assistive device: None Gait Pattern/deviations: Step-through pattern, Decreased stride length, Drifts right/left Gait velocity: WFL     General Gait Details: progressing to supervision, no LOB, some moments of bobbling   Stairs Stairs: Yes Stairs assistance: Min guard Stair Management: One rail Right, Alternating pattern, Forwards Number of Stairs: 12 (flight) General stair comments: up/down without fault   Wheelchair Mobility    Modified Rankin (Stroke Patients Only)       Balance Overall balance assessment: History of Falls, Needs assistance Sitting-balance support: Feet supported Sitting balance-Leahy Scale: Good     Standing balance support: During functional activity, No upper extremity supported Standing balance-Leahy Scale: Fair                   Standardized Balance Assessment Standardized Balance Assessment : Dynamic Gait Index   Dynamic Gait Index Level Surface: Normal Change in Gait Speed: Normal Gait with Horizontal Head Turns: Mild Impairment Gait with Vertical Head Turns: Mild Impairment Gait and Pivot Turn: Normal Step Over Obstacle: Normal Step Around Obstacles: Mild Impairment Steps: Mild Impairment Total Score: 20      Cognition Arousal/Alertness: Awake/alert Behavior During Therapy: WFL for tasks assessed/performed Overall Cognitive Status: Within Functional Limits for tasks assessed  Exercises      General Comments        Pertinent Vitals/Pain Pain Assessment Pain Assessment:  Faces Faces Pain Scale: Hurts little more Pain Location: back pain, chronic Pain Descriptors / Indicators: Aching, Sore Pain Intervention(s): Monitored during session, Limited activity within patient's tolerance    Home Living                          Prior Function            PT Goals (current goals can now be found in the care plan section) Acute Rehab PT Goals Patient Stated Goal: get home and back to normal PT Goal Formulation: With patient Time For Goal Achievement: 02/23/22    Frequency    Min 3X/week      PT Plan      Co-evaluation              AM-PAC PT "6 Clicks" Mobility   Outcome Measure  Help needed turning from your back to your side while in a flat bed without using bedrails?: A Little Help needed moving from lying on your back to sitting on the side of a flat bed without using bedrails?: A Little Help needed moving to and from a bed to a chair (including a wheelchair)?: None Help needed standing up from a chair using your arms (e.g., wheelchair or bedside chair)?: None Help needed to walk in hospital room?: A Little Help needed climbing 3-5 steps with a railing? : A Lot 6 Click Score: 19    End of Session Equipment Utilized During Treatment: Gait belt Activity Tolerance: Patient tolerated treatment well Patient left: in bed;with call bell/phone within reach;with family/visitor present (seated EOB) Nurse Communication: Mobility status PT Visit Diagnosis: Unsteadiness on feet (R26.81);Muscle weakness (generalized) (M62.81);Difficulty in walking, not elsewhere classified (R26.2)     Time: [id number] PT Time Calculation (min) (ACUTE ONLY): 9 min  Charges:  $Gait Training: 8-22 mins                    Randell Detter R. PTA Acute Rehabilitation Services Office: [place] 02/11/2022, 10:35 AM

## 2022-02-11 NOTE — Discharge Summary (Signed)
Physician Discharge Summary  Matthew Shepard [id number] DOB: 1965/07/11 DOA: 02/07/2022  PCP: Patient, No Pcp Per  Admit date: 02/07/2022 Discharge date: 02/11/2022  Admitted From: Home Disposition: Home  Recommendations for Outpatient Follow-up:  Follow up with PCP in 1-2 weeks Follow-up with support program as discussed for alcohol abuse  Home Health: None Equipment/Devices: None required  Discharge Condition: Stable CODE STATUS: Full Diet recommendation: Regular diet as tolerated, vitamin protein supplementation as discussed  Brief/Interim Summary: Matthew Shepard is a 56 y.o. male with medical history significant for alcohol abuse, chronic anxiety/depression, GERD, polyneuropathy, who presented to [facility] ED from home due to witnessed seizures.  The patient drinks alcohol heavily 1 pint/day.  He tried to cut down on drinking for the past few days.  He had a dentist appointment and was told he needed to cut down on drinking, was prescribed amoxicillin 3 days ago by his dentist.  Has been having intermittent nausea and vomiting.  Has been unable to keep anything down for the past week.  Last alcohol intake was morning prior to admission. Noted to have tonic-clonic seizures.  EMS was activated.  Upon EMS arrival he was postictal.  He was brought into the ED for further evaluation.   He was admitted for etoh withdrawal seizures.  He's done well having now completed phenobarbital taper, given his rapid improvement patient is now stable and agreeable for discharge home, will discharge on hydroxyzine in the interim for ongoing anxiety to assist with alcohol cessation.  Close follow-up with PCP and alcohol support program as discussed.  Wife at bedside supportive and agreeable for discharge home given his rapid improvement.  Discharge Diagnoses:  Principal Problem:   Withdrawal seizures ([place])  Complicated Etoh Withdrawal  Alcohol Withdrawal Seizures Multiple seizures prior to admission,  completed phenobarbital Ativan protocol ongoing, no as needed use since 12/1 EEG within normal limits, MRI without acute findings as below Continue multivitamin thiamine and folate at discharge He did not tolerate naltrexone well, requesting hydroxyzine which will be provided at discharge  Per [facility] statutes, patients with seizures are not allowed to drive until  they have been seizure-free for six months. Use caution when using heavy equipment or power tools. Avoid working on ladders or at heights. Take showers instead of baths. Ensure the water temperature is not too high on the home water heater. Do not go swimming alone. When caring for infants or small children, sit down when holding, feeding, or changing them to minimize risk of injury to the child in the event you have a seizure. Also, Maintain good sleep hygiene. Avoid alcohol.    Gait Abnormality Resolved, MRI negative, likely secondary to chronic alcohol use and vitamin deficiency   SIRS without source Fever, tachycardia, tachypnea  Imaging and workup negative, likely secondary to above withdrawals   Elevated LFT's Thrombocytopenia Secondary to chronic call use, INR within normal limits   Prolonged Qtc  Left Bundle Block Follow-up outpatient currently asymptomatic   Hypokalemia Hypomagnesemia Secondary to poor p.o. intake, improved with supplementation   Polyneuropathy Not taking gabapentin per med rec   Chronic Anxiety and Depression Patient longer taking Zoloft per med rec, continue Atarax at discharge, close follow-up with PCP recommended  Discharge Instructions   Allergies as of 02/11/2022       Reactions   Albuterol Palpitations, Rash   Not allergic per SO   Tramadol Anaphylaxis   Not allergic per SO   Penicillins Rash   Not allergic per SO  Red Dye Rash   Not allergic per SO        Medication List     STOP taking these medications    chlordiazePOXIDE 25 MG capsule Commonly known  as: LIBRIUM   gabapentin 300 MG capsule Commonly known as: NEURONTIN   omeprazole 20 MG capsule Commonly known as: PRILOSEC   sertraline 50 MG tablet Commonly known as: ZOLOFT       TAKE these medications    acetaminophen 500 MG tablet Commonly known as: TYLENOL Take 1,000 mg by mouth 2 (two) times daily as needed for moderate pain.   amoxicillin 500 MG capsule Commonly known as: AMOXIL Take 500 mg by mouth every 6 (six) hours.   folic acid 1 MG tablet Commonly known as: FOLVITE Take 1 tablet (1 mg total) by mouth daily. Start taking on: February 12, 2022   hydrOXYzine 25 MG tablet Commonly known as: ATARAX Take 1 tablet (25 mg total) by mouth every 8 (eight) hours as needed for anxiety.   multivitamin with minerals Tabs tablet Take 1 tablet by mouth daily. Start taking on: February 12, 2022   thiamine 100 MG tablet Commonly known as: Vitamin B-1 Take 1 tablet (100 mg total) by mouth daily. Start taking on: February 16, 2022        Allergies  Allergen Reactions   Albuterol Palpitations and Rash    Not allergic per SO   Tramadol Anaphylaxis    Not allergic per SO   Penicillins Rash    Not allergic per SO   Red Dye Rash    Not allergic per SO    Consultations: Neurology  Procedures/Studies: EEG adult  Result Date: 2022-03-05 [name], MD     March 05, 2022  4:04 PM Patient Name: Matthew Shepard MRN: [id number] Epilepsy Attending: [name] Referring Physician/Provider: Zigmund Daniel., MD Date: 03/05/22 Duration: 21.08 mins Patient history: 56yo M with alcohol withdrawal seizure. EEG to evaluate for seizure. Level of alertness:  lethargic AEDs during EEG study: Ativan, Phenobarb Technical aspects: This EEG study was done with scalp electrodes positioned according to the 10-20 International system of electrode placement. Electrical activity was reviewed with band pass filter of 1-70Hz , sensitivity of 7 uV/mm, display speed of 29mm/sec with a  60Hz  notched filter applied as appropriate. EEG data were recorded continuously and digitally stored.  Video monitoring was available and reviewed as appropriate. Description: EEG showed an excessive amount of 15 to 18 Hz beta activity distributed symmetrically and diffusely. Hyperventilation and photic stimulation were not performed.   ABNORMALITY - Excessive beta, generalized IMPRESSION: This study is within normal limits. The excessive beta activity seen in the background is most likely due to the effect of benzodiazepine and is a benign EEG pattern. No seizures or epileptiform discharges were seen throughout the recording.   MR BRAIN WO CONTRAST  Result Date: 03-05-22 CLINICAL DATA:  New onset seizure. EXAM: MRI HEAD WITHOUT CONTRAST TECHNIQUE: Multiplanar, multiecho pulse sequences of the brain and surrounding structures were obtained without intravenous contrast. COMPARISON:  CT head 1 day prior FINDINGS: Brain: There is no acute intracranial hemorrhage, extra-axial fluid collection, or acute infarct. There is mild parenchymal volume loss with prominence of the ventricular system and extra-axial CSF spaces, slightly advanced for age. Gray-white differentiation is preserved. There are a few small foci of of nonspecific FLAIR signal abnormality in the supratentorial white matter and mild periventricular FLAIR signal abnormality which may reflect sequela of mild/early chronic small-vessel  ischemic change. There is no suspicious parenchymal signal abnormality. There is no mass lesion. There is no mass effect or midline shift. The hippocampi are normal in signal and architecture. Vascular: Normal flow voids. Skull and upper cervical spine: Normal marrow signal. Sinuses/Orbits: There is moderate mucosal thickening in the right maxillary sinus. The globes and orbits are unremarkable. Other: None. IMPRESSION: No acute intracranial pathology or epileptogenic focus identified. Electronically  Signed   By: Valetta Mole M.D.   On: 02/08/2022 10:29   CT Head Wo Contrast  Result Date: 02/07/2022 CLINICAL DATA:  Seizure, new-onset, no history of trauma EXAM: CT HEAD WITHOUT CONTRAST TECHNIQUE: Contiguous axial images were obtained from the base of the skull through the vertex without intravenous contrast. RADIATION DOSE REDUCTION: This exam was performed according to the departmental dose-optimization program which includes automated exposure control, adjustment of the mA and/or kV according to patient size and/or use of iterative reconstruction technique. COMPARISON:  CT head 09/10/2018 FINDINGS: Brain: No evidence of large-territorial acute infarction. No parenchymal hemorrhage. No mass lesion. No extra-axial collection. No mass effect or midline shift. No hydrocephalus. Basilar cisterns are patent. Vascular: No hyperdense vessel. Skull: No acute fracture or focal lesion. Sinuses/Orbits: Almost complete opacification of the right maxillary sinus. Associated mild hyper trophy of the sinus wall consistent with chronic sinus disease. Paranasal sinuses and mastoid air cells are clear. The orbits are unremarkable. Other: None. IMPRESSION: No acute intracranial abnormality. Electronically Signed   By: Iven Finn M.D.   On: 02/07/2022 23:01   DG Chest Port 1 View  Result Date: 02/07/2022 CLINICAL DATA:  Seizure-like activity. EXAM: PORTABLE CHEST 1 VIEW COMPARISON:  November 03, 2020 FINDINGS: The heart size and mediastinal contours are within normal limits. There is no evidence of an acute infiltrate, pleural effusion or pneumothorax. The visualized skeletal structures are unremarkable. IMPRESSION: No active cardiopulmonary disease. Electronically Signed   By: Virgina Norfolk M.D.   On: 02/07/2022 21:04     Subjective: No acute issues or events overnight back to baseline denies nausea vomiting diarrhea constipation headache fevers chills or chest pain   Discharge Exam: Vitals:   02/11/22  0424 02/11/22 0801  BP: 130/89 131/88  Pulse:  68  Resp: 17 20  Temp: 98.1 F (36.7 C) 99.1 F (37.3 C)  SpO2: 100% 99%   Vitals:   02/10/22 2216 02/11/22 0114 02/11/22 0424 02/11/22 0801  BP: 133/89 121/79 130/89 131/88  Pulse: 83 82  68  Resp: 18 18 17 20   Temp: 98.5 F (36.9 C) 98.2 F (36.8 C) 98.1 F (36.7 C) 99.1 F (37.3 C)  TempSrc: Oral Oral Oral Oral  SpO2: 98% 100% 100% 99%  Weight:      Height:        General: Pt is alert, awake, not in acute distress, cachectic malnourished appearance Cardiovascular: RRR, S1/S2 +, no rubs, no gallops Respiratory: CTA bilaterally, no wheezing, no rhonchi Abdominal: Soft, NT, ND, bowel sounds + Extremities: no edema, no cyanosis    The results of significant diagnostics from this hospitalization (including imaging, microbiology, ancillary and laboratory) are listed below for reference.     Microbiology: Recent Results (from the past 240 hour(s))  Resp Panel by RT-PCR (Flu A&B, Covid) Anterior Nasal Swab     Status: None   Collection Time: 02/07/22  8:14 PM   Specimen: Anterior Nasal Swab  Result Value Ref Range Status   SARS Coronavirus 2 by RT PCR NEGATIVE NEGATIVE Final    Comment: (NOTE)  SARS-CoV-2 target nucleic acids are NOT DETECTED.  The SARS-CoV-2 RNA is generally detectable in upper respiratory specimens during the acute phase of infection. The lowest concentration of SARS-CoV-2 viral copies this assay can detect is 138 copies/mL. A negative result does not preclude SARS-Cov-2 infection and should not be used as the sole basis for treatment or other patient management decisions. A negative result may occur with  improper specimen collection/handling, submission of specimen other than nasopharyngeal swab, presence of viral mutation(s) within the areas targeted by this assay, and inadequate number of viral copies(<138 copies/mL). A negative result must be combined with clinical observations, patient history,  and epidemiological information. The expected result is Negative.  Fact Sheet for Patients:  EntrepreneurPulse.com.au  Fact Sheet for Healthcare Providers:  IncredibleEmployment.be  This test is no t yet approved or cleared by the [place] FDA and  has been authorized for detection and/or diagnosis of SARS-CoV-2 by FDA under an Emergency Use Authorization (EUA). This EUA will remain  in effect (meaning this test can be used) for the duration of the COVID-19 declaration under Section 564(b)(1) of the Act, 21 U.S.C.section 360bbb-3(b)(1), unless the authorization is terminated  or revoked sooner.       Influenza A by PCR NEGATIVE NEGATIVE Final   Influenza B by PCR NEGATIVE NEGATIVE Final    Comment: (NOTE) The Xpert Xpress SARS-CoV-2/FLU/RSV plus assay is intended as an aid in the diagnosis of influenza from Nasopharyngeal swab specimens and should not be used as a sole basis for treatment. Nasal washings and aspirates are unacceptable for Xpert Xpress SARS-CoV-2/FLU/RSV testing.  Fact Sheet for Patients: EntrepreneurPulse.com.au  Fact Sheet for Healthcare Providers: IncredibleEmployment.be  This test is not yet approved or cleared by the [place] FDA and has been authorized for detection and/or diagnosis of SARS-CoV-2 by FDA under an Emergency Use Authorization (EUA). This EUA will remain in effect (meaning this test can be used) for the duration of the COVID-19 declaration under Section 564(b)(1) of the Act, 21 U.S.C. section 360bbb-3(b)(1), unless the authorization is terminated or revoked.  Performed at [facility], [place] [address], [place], [place] [address]      Labs: BNP (last 3 results) No results for input(s): "BNP" in the last 8760 hours. Basic Metabolic Panel: Recent Labs  Lab 02/07/22 2200 02/08/22 0830 02/09/22 0253 02/10/22 0230 02/11/22 0659  NA 132* 134* 135  136 139  K 2.6* 3.0* 3.3* 3.7 4.7  CL 96* 103 99 104 104  CO2 25 24 26 26 26   GLUCOSE 118* 90 94 125* 102*  BUN 10 9 18 10 8   CREATININE 1.03 0.83 0.96 0.85 0.76  CALCIUM 8.2* 8.1* 8.8* 8.1* 9.0  MG 1.2* 2.1 1.6* 1.4* 1.6*  PHOS  --   --  3.3 2.9 3.4   Liver Function Tests: Recent Labs  Lab 02/07/22 2200 02/09/22 0253 02/10/22 0230  AST 124* 99* 78*  ALT 66* 63* 57*  ALKPHOS 84 85 93  BILITOT 1.1 1.3* 0.5  PROT 5.7* 5.8* 5.4*  ALBUMIN 3.0* 3.0* 2.7*   Recent Labs  Lab 02/07/22 2200  LIPASE 31   No results for input(s): "AMMONIA" in the last 168 hours. CBC: Recent Labs  Lab 02/07/22 2031 02/08/22 0250 02/09/22 0253 02/10/22 0230 02/11/22 0659  WBC 9.4 6.5 6.9 4.9 5.2  NEUTROABS 7.2  --  4.5  --   --   HGB 13.5 12.3* 13.1 12.2* 12.5*  HCT 37.6* 34.9* 36.8* 34.6* 35.3*  MCV 99.2  100.9* 100.5* 101.8* 102.6*  PLT 145* 104* 105* 106* 134*   Cardiac Enzymes: No results for input(s): "CKTOTAL", "CKMB", "CKMBINDEX", "TROPONINI" in the last 168 hours. BNP: Invalid input(s): "POCBNP" CBG: No results for input(s): "GLUCAP" in the last 168 hours. D-Dimer No results for input(s): "DDIMER" in the last 72 hours. Hgb A1c No results for input(s): "HGBA1C" in the last 72 hours. Lipid Profile No results for input(s): "CHOL", "HDL", "LDLCALC", "TRIG", "CHOLHDL", "LDLDIRECT" in the last 72 hours. Thyroid function studies No results for input(s): "TSH", "T4TOTAL", "T3FREE", "THYROIDAB" in the last 72 hours.  Invalid input(s): "FREET3" Anemia work up Recent Labs    02/09/22 0253  VITAMINB12 620  FOLATE 21.0   Urinalysis    Component Value Date/Time   COLORURINE AMBER (A) 02/07/2022 2328   APPEARANCEUR HAZY (A) 02/07/2022 2328   LABSPEC 1.020 02/07/2022 2328   PHURINE 6.0 02/07/2022 2328   GLUCOSEU NEGATIVE 02/07/2022 2328   HGBUR NEGATIVE 02/07/2022 2328   BILIRUBINUR NEGATIVE 02/07/2022 2328   KETONESUR NEGATIVE 02/07/2022 2328   PROTEINUR 30 (A) 02/07/2022  2328   UROBILINOGEN 1.0 03/10/2014 1545   NITRITE NEGATIVE 02/07/2022 2328   LEUKOCYTESUR NEGATIVE 02/07/2022 2328   Sepsis Labs Recent Labs  Lab 02/08/22 0250 02/09/22 0253 02/10/22 0230 02/11/22 0659  WBC 6.5 6.9 4.9 5.2   Microbiology Recent Results (from the past 240 hour(s))  Resp Panel by RT-PCR (Flu A&B, Covid) Anterior Nasal Swab     Status: None   Collection Time: 02/07/22  8:14 PM   Specimen: Anterior Nasal Swab  Result Value Ref Range Status   SARS Coronavirus 2 by RT PCR NEGATIVE NEGATIVE Final    Comment: (NOTE) SARS-CoV-2 target nucleic acids are NOT DETECTED.  The SARS-CoV-2 RNA is generally detectable in upper respiratory specimens during the acute phase of infection. The lowest concentration of SARS-CoV-2 viral copies this assay can detect is 138 copies/mL. A negative result does not preclude SARS-Cov-2 infection and should not be used as the sole basis for treatment or other patient management decisions. A negative result may occur with  improper specimen collection/handling, submission of specimen other than nasopharyngeal swab, presence of viral mutation(s) within the areas targeted by this assay, and inadequate number of viral copies(<138 copies/mL). A negative result must be combined with clinical observations, patient history, and epidemiological information. The expected result is Negative.  Fact Sheet for Patients:  EntrepreneurPulse.com.au  Fact Sheet for Healthcare Providers:  IncredibleEmployment.be  This test is no t yet approved or cleared by the [place] FDA and  has been authorized for detection and/or diagnosis of SARS-CoV-2 by FDA under an Emergency Use Authorization (EUA). This EUA will remain  in effect (meaning this test can be used) for the duration of the COVID-19 declaration under Section 564(b)(1) of the Act, 21 U.S.C.section 360bbb-3(b)(1), unless the authorization is terminated  or  revoked sooner.       Influenza A by PCR NEGATIVE NEGATIVE Final   Influenza B by PCR NEGATIVE NEGATIVE Final    Comment: (NOTE) The Xpert Xpress SARS-CoV-2/FLU/RSV plus assay is intended as an aid in the diagnosis of influenza from Nasopharyngeal swab specimens and should not be used as a sole basis for treatment. Nasal washings and aspirates are unacceptable for Xpert Xpress SARS-CoV-2/FLU/RSV testing.  Fact Sheet for Patients: EntrepreneurPulse.com.au  Fact Sheet for Healthcare Providers: IncredibleEmployment.be  This test is not yet approved or cleared by the [place] FDA and has been authorized for detection and/or diagnosis of SARS-CoV-2 by  FDA under an Emergency Use Authorization (EUA). This EUA will remain in effect (meaning this test can be used) for the duration of the COVID-19 declaration under Section 564(b)(1) of the Act, 21 U.S.C. section 360bbb-3(b)(1), unless the authorization is terminated or revoked.  Performed at [facility], [place] [address], [place], [place] [address]      Time coordinating discharge: Over 30 minutes  SIGNED:   Little Ishikawa, DO [facility] 02/11/2022, 10:33 AM Pager   If 7PM-7AM, please contact night-coverage [email or web address]

## 2022-02-11 NOTE — Plan of Care (Signed)
Adequate for discharge home

## 2022-02-11 NOTE — Progress Notes (Signed)
Patient discharged home today per MD orders. Male friend at bedside and patient stated that he was comfortable with reviewing discharge paperwork with her in the room as well as her being his ride home. Patient vital signs WDL. Discharge Instructions including follow up appointments, medications, and education reviewed with patient. Patient verbalizes understanding. Bedside RN to take IV out. Daughter, Dahlia Client, called per night shift note and made aware of patient being discharged.

## 2022-05-19 ENCOUNTER — Emergency Department (HOSPITAL_BASED_OUTPATIENT_CLINIC_OR_DEPARTMENT_OTHER)
Admission: EM | Admit: 2022-05-19 | Discharge: 2022-05-19 | Disposition: A | Discharge status: Home / Self Care | Payer: Self-pay | Attending: Emergency Medicine | Admitting: Emergency Medicine

## 2022-05-19 DIAGNOSIS — S0083XA Contusion of other part of head, initial encounter: Secondary | ICD-10-CM | POA: Insufficient documentation

## 2022-05-19 DIAGNOSIS — F10920 Alcohol use, unspecified with intoxication, uncomplicated: Secondary | ICD-10-CM | POA: Insufficient documentation

## 2022-05-19 DIAGNOSIS — S0993XA Unspecified injury of face, initial encounter: Secondary | ICD-10-CM

## 2022-05-19 NOTE — ED Notes (Addendum)
Admits to being alcoholic and intoxicated. Pt boisterous, jovial, and cooperative. NAD. Answers questions, participatory, interactive, follows commands. Abrasion noted to upper outer R eye/ brow. No active bleeding. Minimal swelling. Denies HA, LOC, NV, pain, recent sickness/ illness. No complaints. Admits to ETOH. Denies drug use, hallucinations, sx or complaints. Declines drink/snack at this time. [place] [employer] in to see. Reports "don't have seizures, don't take sz meds".
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
## 2022-05-19 NOTE — Discharge Instructions (Addendum)
Please return to the emergency department if you have worsening headache, dizziness, vomiting, strokelike symptoms, or any other emergency medical concerns.  Please do not drive or operate a vehicle or any type of heavy machinery until you are sober.

## 2022-05-19 NOTE — ED Notes (Signed)
EDP in to see. PD remains at [facility].
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
## 2023-01-24 IMAGING — DX DG CHEST 1V PORT
2 series · 2 of 2 positions shown · non-contrast
Comparison: Radiograph 01/01/2020

CLINICAL DATA: Palpitations, history of atrial fibrillation

EXAM:
PORTABLE CHEST 1 VIEW

[chest ap (1 of 2)]
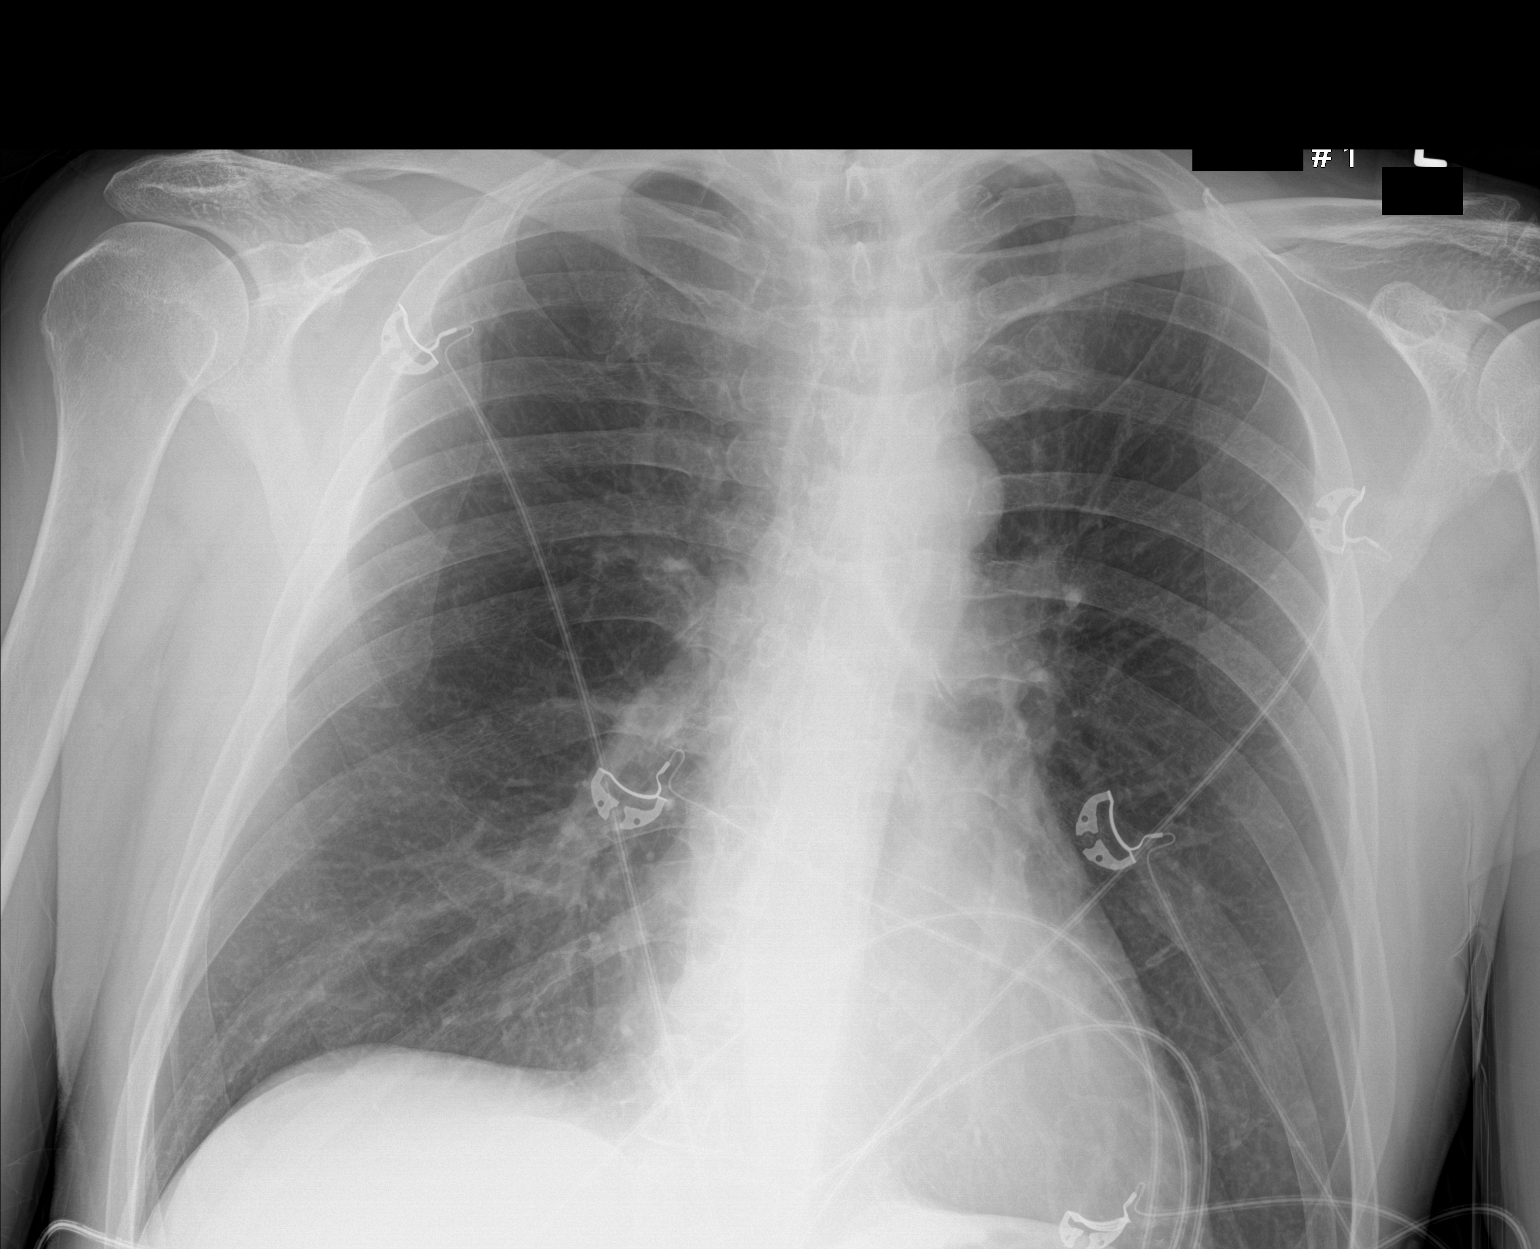

[chest ap (2 of 2)]
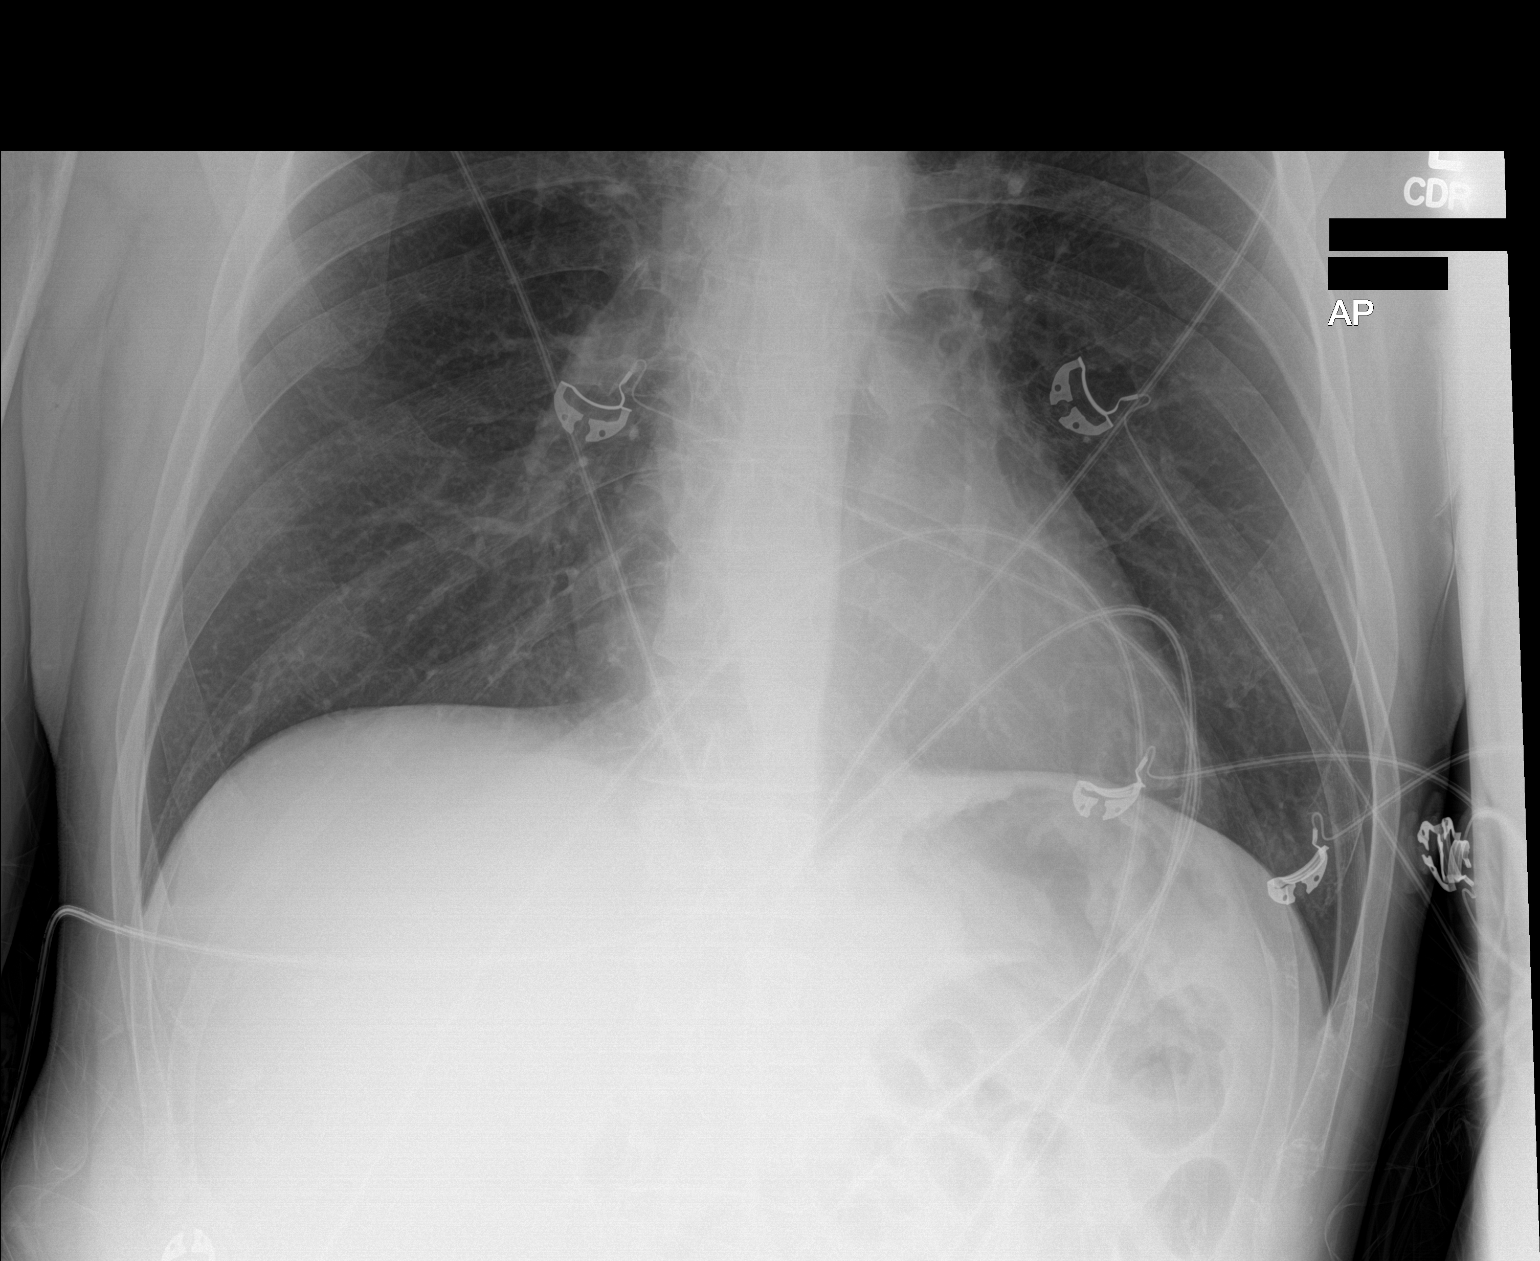

[2 of 2 positions shown; findings below may reference images not displayed]

FINDINGS: No consolidation, features of edema, pneumothorax, or effusion.
Pulmonary vascularity is normally distributed. The cardiomediastinal
contours are unremarkable. No acute osseous or soft tissue
abnormality.
IMPRESSION: No acute cardiopulmonary abnormality.
